# Patient Record
Sex: Female | Born: 1945 | Race: White | Hispanic: No | Marital: Married | Smoking: Former smoker
Health system: Southern US, Community
[De-identification: ages and names within clinical notes are randomized; demographics above are authoritative.]

## PROBLEM LIST (undated history)

## (undated) DIAGNOSIS — H9190 Unspecified hearing loss, unspecified ear: Secondary | ICD-10-CM

## (undated) DIAGNOSIS — K219 Gastro-esophageal reflux disease without esophagitis: Secondary | ICD-10-CM

## (undated) DIAGNOSIS — M1990 Unspecified osteoarthritis, unspecified site: Secondary | ICD-10-CM

## (undated) DIAGNOSIS — M797 Fibromyalgia: Secondary | ICD-10-CM

## (undated) DIAGNOSIS — E669 Obesity, unspecified: Secondary | ICD-10-CM

## (undated) DIAGNOSIS — L42 Pityriasis rosea: Secondary | ICD-10-CM

## (undated) DIAGNOSIS — I10 Essential (primary) hypertension: Secondary | ICD-10-CM

## (undated) DIAGNOSIS — E785 Hyperlipidemia, unspecified: Secondary | ICD-10-CM

## (undated) DIAGNOSIS — N189 Chronic kidney disease, unspecified: Secondary | ICD-10-CM

## (undated) DIAGNOSIS — E119 Type 2 diabetes mellitus without complications: Secondary | ICD-10-CM

## (undated) DIAGNOSIS — Z8619 Personal history of other infectious and parasitic diseases: Secondary | ICD-10-CM

## (undated) HISTORY — DX: Obesity, unspecified: E66.9

## (undated) HISTORY — PX: STAPEDECTOMY: SHX2435

## (undated) HISTORY — DX: Pityriasis rosea: L42

## (undated) HISTORY — PX: COLONOSCOPY: SHX174

## (undated) HISTORY — PX: ABDOMINAL HYSTERECTOMY: SHX81

## (undated) HISTORY — PX: CHOLECYSTECTOMY: SHX55

## (undated) HISTORY — DX: Chronic kidney disease, unspecified: N18.9

## (undated) HISTORY — DX: Type 2 diabetes mellitus without complications: E11.9

## (undated) HISTORY — PX: APPENDECTOMY: SHX54

---

## 1997-10-03 ENCOUNTER — Emergency Department (HOSPITAL_COMMUNITY): Admission: EM | Admit: 1997-10-03 | Discharge: 1997-10-03 | Payer: Self-pay | Admitting: Emergency Medicine

## 1997-10-16 ENCOUNTER — Ambulatory Visit (HOSPITAL_COMMUNITY): Admission: RE | Admit: 1997-10-16 | Discharge: 1997-10-16 | Payer: Self-pay

## 1998-05-31 ENCOUNTER — Emergency Department (HOSPITAL_COMMUNITY): Admission: EM | Admit: 1998-05-31 | Discharge: 1998-05-31 | Payer: Self-pay

## 1999-11-15 ENCOUNTER — Encounter: Payer: Self-pay | Admitting: Internal Medicine

## 1999-11-15 ENCOUNTER — Encounter: Admission: RE | Admit: 1999-11-15 | Discharge: 1999-11-15 | Payer: Self-pay | Admitting: Internal Medicine

## 2002-06-25 ENCOUNTER — Encounter: Payer: Self-pay | Admitting: General Surgery

## 2002-06-25 ENCOUNTER — Encounter: Admission: RE | Admit: 2002-06-25 | Discharge: 2002-06-25 | Payer: Self-pay | Admitting: General Surgery

## 2003-04-23 ENCOUNTER — Encounter: Admission: RE | Admit: 2003-04-23 | Discharge: 2003-04-27 | Payer: Self-pay | Admitting: Internal Medicine

## 2004-04-03 DIAGNOSIS — Z8619 Personal history of other infectious and parasitic diseases: Secondary | ICD-10-CM

## 2004-04-03 HISTORY — DX: Personal history of other infectious and parasitic diseases: Z86.19

## 2004-04-03 HISTORY — PX: CARPAL TUNNEL RELEASE: SHX101

## 2004-04-03 HISTORY — PX: WRIST ARTHROSCOPY: SUR100

## 2004-04-03 LAB — HM COLONOSCOPY

## 2004-08-20 ENCOUNTER — Emergency Department (HOSPITAL_COMMUNITY): Admission: EM | Admit: 2004-08-20 | Discharge: 2004-08-20 | Payer: Self-pay | Admitting: Emergency Medicine

## 2004-12-06 ENCOUNTER — Encounter: Admission: RE | Admit: 2004-12-06 | Discharge: 2004-12-06 | Payer: Self-pay | Admitting: Internal Medicine

## 2005-01-03 ENCOUNTER — Ambulatory Visit (HOSPITAL_BASED_OUTPATIENT_CLINIC_OR_DEPARTMENT_OTHER): Admission: RE | Admit: 2005-01-03 | Discharge: 2005-01-03 | Payer: Self-pay | Admitting: Orthopedic Surgery

## 2005-01-03 ENCOUNTER — Ambulatory Visit (HOSPITAL_COMMUNITY): Admission: RE | Admit: 2005-01-03 | Discharge: 2005-01-03 | Payer: Self-pay | Admitting: Orthopedic Surgery

## 2005-01-23 ENCOUNTER — Encounter: Admission: RE | Admit: 2005-01-23 | Discharge: 2005-01-23 | Payer: Self-pay | Admitting: Internal Medicine

## 2005-05-16 ENCOUNTER — Encounter: Admission: RE | Admit: 2005-05-16 | Discharge: 2005-05-16 | Payer: Self-pay | Admitting: Internal Medicine

## 2007-01-14 ENCOUNTER — Encounter: Admission: RE | Admit: 2007-01-14 | Discharge: 2007-01-14 | Payer: Self-pay | Admitting: Internal Medicine

## 2007-04-04 HISTORY — PX: NASAL HEMORRHAGE CONTROL: SHX287

## 2007-12-09 ENCOUNTER — Emergency Department (HOSPITAL_BASED_OUTPATIENT_CLINIC_OR_DEPARTMENT_OTHER): Admission: EM | Admit: 2007-12-09 | Discharge: 2007-12-09 | Payer: Self-pay | Admitting: Emergency Medicine

## 2007-12-12 ENCOUNTER — Observation Stay (HOSPITAL_COMMUNITY): Admission: EM | Admit: 2007-12-12 | Discharge: 2007-12-13 | Payer: Self-pay | Admitting: Emergency Medicine

## 2008-01-20 ENCOUNTER — Encounter: Admission: RE | Admit: 2008-01-20 | Discharge: 2008-01-20 | Payer: Self-pay | Admitting: Internal Medicine

## 2008-09-18 ENCOUNTER — Encounter: Admission: RE | Admit: 2008-09-18 | Discharge: 2008-09-18 | Payer: Self-pay | Admitting: Internal Medicine

## 2009-03-29 ENCOUNTER — Encounter: Admission: RE | Admit: 2009-03-29 | Discharge: 2009-03-29 | Payer: Self-pay | Admitting: Internal Medicine

## 2010-04-15 ENCOUNTER — Encounter
Admission: RE | Admit: 2010-04-15 | Discharge: 2010-04-15 | Payer: Self-pay | Source: Home / Self Care | Attending: Unknown Physician Specialty | Admitting: Unknown Physician Specialty

## 2010-04-24 ENCOUNTER — Encounter: Payer: Self-pay | Admitting: Internal Medicine

## 2010-08-16 NOTE — Consult Note (Signed)
[name], [name]                ACCOUNT NO.:  [id number]   MEDICAL RECORD NO.:  [id number]          PATIENT TYPE:  INP   LOCATION:  5118                         FACILITY:  [facility]   PHYSICIAN:  Corinna L. Lendell Caprice, MDDATE OF BIRTH:  1946-03-22   DATE OF CONSULTATION:  12/13/2007  DATE OF DISCHARGE:  12/13/2007                                 CONSULTATION   REASON FOR CONSULTATION:  Chest/abdominal pain.   IMPRESSION/RECOMMENDATIONS:  1. Left chest/left upper quadrant and flank pain:  I doubt this is      cardiac ischemia.  Her symptoms were very atypical.  Her EKG is      normal.  She has no history of coronary artery disease and her only      cardiac risk factor is smoking.  However, she is anemic and may be      having some strain.  Therefore, I will check a set of cardiac      enzymes.  2. Acute blood loss anemia.  3. Epistaxis status post endoscopic cautery of left posterior      epistaxis.  4. Tobacco abuse, counseled against.   HISTORY OF PRESENT ILLNESS:  [name] is a pleasant 65 year old white  female patient of Dr. Valentina Lucks who was admitted to Dr. Jenne Pane' service  after undergoing the above procedure last night.  This morning, she  began having left upper quadrant/left lower chest and thigh pain.  It  was crampy in nature.  It felt like gas pains.  Her husband reports that  she was doubled over.  It was sharp.  It was not pleuritic in nature.  She has no shortness of breath.  She feels better after getting  simethicone and morphine.   PAST MEDICAL HISTORY:  None.   MEDICATIONS:  Home medications none.  Here she has received morphine,  simethicone, and aspirin.   SOCIAL HISTORY:  The patient works at a bank.  She smokes about half a  pack of cigarettes a day.  She is married.  No excessive drinking or  drug use.   FAMILY HISTORY:  Her grandfather had heart disease.   REVIEW OF SYSTEMS:  As above, otherwise negative.   PHYSICAL EXAMINATION:  VITAL SIGNS:   Temperature is 98.5, pulse 100,  respiratory rate 18, blood pressure 120/72, and oxygen saturation 94% on  room air.  GENERAL:  The patient is well-nourished, well-developed in no acute  distress.  HEENT:  Normocephalic and atraumatic.  Pupils equal, round, and reactive  to light.  Pale conjunctivae.  Moist mucous membranes.  NECK:  Supple.  No carotid bruits.  No lymphadenopathy.  LUNGS:  Clear to auscultation bilaterally without wheezes, rhonchi, or  rales.  CARDIOVASCULAR:  Regular rate and rhythm without murmurs, gallops, or  rubs.  No chest wall tenderness to palpation.  ABDOMEN:  Normal bowel sounds, soft, nontender, and nondistended.  GU/RECTAL:  Deferred.  EXTREMITIES:  No clubbing, cyanosis, or edema.  SKIN:  No rash.  PSYCHIATRIC:  Normal affect.  NEUROLOGIC:  Alert and oriented.  Cranial nerves and sensorimotor exams  were intact.   LABORATORY  DATA:  Her white blood cell count is 10.9, hemoglobin 8.4,  hematocrit 24, and MCV is 91.  On December 09, 2007, her hemoglobin was  11.6.  EKG shows sinus tachycardia.   Thank you Dr. Jenne Pane for this consultation.  If the patient's cardiac  enzymes, symptoms, and hemoglobin remains stable, she may be discharged  from my standpoint.      Corinna L. Lendell Caprice, MD  Electronically Signed     CLS/MEDQ  D:  12/13/2007  T:  12/13/2007  Job:  [id number]   cc:   Thora Lance, M.D.

## 2010-08-16 NOTE — Op Note (Signed)
[name], [name]                ACCOUNT NO.:  [id number]   MEDICAL RECORD NO.:  [id number]          PATIENT TYPE:  INP   LOCATION:  5118                         FACILITY:  [facility]   PHYSICIAN:  Antony Contras, MD     DATE OF BIRTH:  03-01-1946   DATE OF PROCEDURE:  12/12/2007  DATE OF DISCHARGE:                               OPERATIVE REPORT   PREOPERATIVE DIAGNOSIS:  Left posterior epistaxis.   POSTOPERATIVE DIAGNOSIS:  Left posterior epistaxis.   PROCEDURE:  Endoscopic cautery of left posterior epistaxis with  posterior packing.   SURGEON:  Excell Seltzer. Jenne Pane, MD   ANESTHESIA:  General endotracheal anesthesia.   COMPLICATIONS:  None.   INDICATIONS:  The patient is a 65 year old white female who has had  repetitive bleeding from the left side of her nose for the past 5 days  that has not responded to multiple attempts at packing including  endoscopic packing in the office.  She has resumed bleeding today and  returned to the emergency department and was brought to the operating  room for management under anesthesia.   FINDINGS:  A bleeding site was easily identified at the posterior middle  meatus underneath the middle turbinate, which appears to represent the  sphenopalatine artery.  Bleeding was controlled with electrocautery and  packing with thrombin-soaked Gelfoam and a Merocel pack.   DESCRIPTION OF PROCEDURE:  The patient was identified in the holding  room and informed consent having been obtained including discussion of  risks, benefits, alternatives, the patient was brought to the operative  suite, and put on the operative table in supine position.  Anesthesia  was induced and the patient was intubated by anesthesia team without  difficulty.  The patient was given intravenous fluids for resuscitation  during the procedure.  The nose was packed with Afrin pledgets on both  sides and the face was prepped and draped in sterile fashion.  Pledget  was removed after  several minutes and the right passage was first  examined with a 0-degree telescope and clot suctioned.  No bleeding was  seen.  On the left side, the 0-degree telescope was also used and clot  and fibrillar Surgicel were removed using suction and forceps.  The  bleeding site was then identified as described above.  Suction cautery  in the setting of 30 was then used to cauterize the area.  At first,  this did not fully controlled the bleeding and a hole was formed in the  posterior root of the middle turbinate as a result.  A through cut  forceps was used to divide the inferior bridge of tissue, which allowed  better cautery of the vessel.  This was again performed with a suction  cautery.  After doing this, there was still slight oozing from the site,  and so a piece of Gelfoam soaked in thrombin was packed against the  area.  This controlled bleeding completely even with blood pressure  being elevated slightly with  medication.  In order to hold the Gelfoam in place, a small Merocel pack  was then placed under  the middle turbinate against the Gelfoam.  The  nose was suctioned and as was the throat.  The patient was then turned  back to anesthesia for wake up, was extubated, and moved to recovery  room in stable condition.      Antony Contras, MD  Electronically Signed     DDB/MEDQ  D:  12/12/2007  T:  12/13/2007  Job:  [id number]

## 2010-08-19 NOTE — Op Note (Signed)
[name], [name]                ACCOUNT NO.:  [id number]   MEDICAL RECORD NO.:  [id number]          PATIENT TYPE:  AMB   LOCATION:  DSC                          FACILITY:  [facility]   PHYSICIAN:  Katy Fitch. Sypher, M.D. DATE OF BIRTH:  1946-01-28   DATE OF PROCEDURE:  01/03/2005  DATE OF DISCHARGE:                                 OPERATIVE REPORT   PREOP DIAGNOSIS:  1.  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  2.  Possible volar ganglion versus synovial cyst due to chronic      scaphotrapezial trapezoid arthrosis.  3.  Chronic right carpal tunnel syndrome with positive electrodiagnostic      studies.   POSTOP DIAGNOSIS:  1.  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  2.  Possible volar ganglion versus synovial cyst due to chronic      scaphotrapezial trapezoid arthrosis.  3.  Chronic right carpal tunnel syndrome with positive electrodiagnostic      studies.   OPERATION:  1.  Examination of right wrist under anesthesia confirming synovial cyst      without evidence of typical volar ganglion.  2.  Injection of right wrist scaphotrapezial trapezoid joint with 1 mL of      Depo-Medrol 40 milligrams per mL and 1 mL of 2% plain lidocaine.  3.  Right carpal tunnel release.   OPERATING SURGEON:  Josephine Igo.   ASSISTANT:  Annye Rusk PA-C.   ANESTHESIA:  General by LMA, supervising anesthesiologist is Dr. Autumn Patty.   INDICATIONS:  [name] is a 65 year old right-hand dominant  woman referred through the courtesy of Lavinia Sharps, family nurse  practitioner employed at [employer]. She was noted have a painful and  numb right hand. [name] identified a volar swelling over the  scaphotrapezial trapezoid joint and clinical findings including a positive  Tinel sign and a positive wrist flexion test consistent with right carpal  tunnel syndrome.   She referred [name] for a upper extremity  orthopedic consult. Clinical  examination confirmed scaphotrapezial trapezoid arthrosis on clinical  examination as well as plain x-rays and an ill-defined volar swelling  consistent with a ganglion versus synovial cyst due to her chronic  arthrosis. 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   Due to failed respond to nonoperative measures, she is brought to the  operating at this time for release of her right transversal carpal ligament  and injection of her scaphotrapezial trapezoid joint with possible  debridement.   PROCEDURE:  [name] is brought to the operating room and placed supine  position on the operating table.   Following anesthesia consultation by Dr. Sampson Goon, general anesthesia by  LMA was selected.   Following induction of stable general anesthesia, the right arm was prepped  with Betadine soap solution, sterilely draped.   Under anesthesia the scaphotrapezial trapezoid joint and Watson's maneuver  carefully tested for stability. There was hypermobility of the  scaphotrapezial trapezoid joint with  synovial thickening but a typical volar  ganglion could not be confirmed. The plain x-rays were reviewed and once  again documented bone-on-bone arthropathy at the scaphotrapezial trapezoid  joint.   The right arm was then prepped with Betadine soap solution and sterilely  draped. Following exsanguination of the limb with Esmarch bandage, an  arterial tourniquet on the proximal brachium was inflated to 230 mmHg.  Procedure commenced with a short incision in line of the ring finger in the  palm.  The subcutaneous tissues were carefully divided revealing palmar  fascia. The was split longitudinally to reveal the common sensory branch of  the median nerve.   These were followed back to transverse carpal ligament which was carefully   isolated from the median nerve. The ligament was then released along its  ulnar border extending into the distal forearm. There was marked  tenosynovitis within the ulnar bursa. The nerve was clearly compressed  against the distal margin of the transverse carpal ligament. The canal was  widely opened upon release of the ligament.   The wound was then repaired with intradermal 3-0 Prolene. Attention was then  directed the scaphotrapezial trapezoid joint. A 27 gauge needle was threaded  directly into the joint and a 2 mL mixture of 1 mL of Depo-Medrol 40 mg per  mL and 1 mL of 2% lidocaine was injected directly in the scaphotrapezial  trapezoid joint.   I elected not to explore the joint as the synovial cyst may involute  following injection.   [name] tolerated surgery and anesthesia well. She was placed in a  compressive dressing with Xeroflo, sterile gauze and a volar plaster splint  supporting the right wrist. There no apparent complications.      Katy Fitch Sypher, M.D.  Electronically Signed     RVS/MEDQ  D:  01/03/2005  T:  01/03/2005  Job:  [id number]

## 2011-01-04 LAB — CBC
HCT: 24.8 — ABNORMAL LOW
HCT: 33.7 — ABNORMAL LOW
Hemoglobin: 11.6 — ABNORMAL LOW
Platelets: 264
Platelets: 242
RBC: 3.78 — ABNORMAL LOW
WBC: 5.3

## 2011-01-04 LAB — DIFFERENTIAL
Eosinophils Relative: 3
Lymphocytes Relative: 63 — ABNORMAL HIGH
Lymphs Abs: 3.3
Monocytes Relative: 8

## 2011-01-04 LAB — PROTIME-INR: Prothrombin Time: 13

## 2011-01-04 LAB — CARDIAC PANEL(CRET KIN+CKTOT+MB+TROPI): Total CK: 54

## 2011-01-04 LAB — HEMOGLOBIN AND HEMATOCRIT, BLOOD: HCT: 22.3 — ABNORMAL LOW

## 2011-03-02 ENCOUNTER — Other Ambulatory Visit: Payer: Self-pay | Admitting: Orthopedic Surgery

## 2011-03-02 DIAGNOSIS — M542 Cervicalgia: Secondary | ICD-10-CM

## 2011-03-03 ENCOUNTER — Ambulatory Visit
Admission: RE | Admit: 2011-03-03 | Discharge: 2011-03-03 | Disposition: A | Discharge status: Home / Self Care | Payer: Private Health Insurance - Indemnity | Source: Ambulatory Visit | Attending: Orthopedic Surgery | Admitting: Orthopedic Surgery

## 2011-03-03 DIAGNOSIS — M542 Cervicalgia: Secondary | ICD-10-CM

## 2011-03-05 ENCOUNTER — Other Ambulatory Visit: Payer: Self-pay

## 2011-03-06 ENCOUNTER — Other Ambulatory Visit: Payer: Self-pay

## 2011-03-14 ENCOUNTER — Other Ambulatory Visit: Payer: Self-pay | Admitting: Family Medicine

## 2011-03-14 DIAGNOSIS — E041 Nontoxic single thyroid nodule: Secondary | ICD-10-CM

## 2011-03-16 ENCOUNTER — Ambulatory Visit
Admission: RE | Admit: 2011-03-16 | Discharge: 2011-03-16 | Disposition: A | Discharge status: Home / Self Care | Payer: Private Health Insurance - Indemnity | Source: Ambulatory Visit | Attending: Family Medicine | Admitting: Family Medicine

## 2011-03-16 DIAGNOSIS — E041 Nontoxic single thyroid nodule: Secondary | ICD-10-CM

## 2011-04-25 ENCOUNTER — Other Ambulatory Visit: Payer: Self-pay | Admitting: Internal Medicine

## 2011-04-25 DIAGNOSIS — Z1231 Encounter for screening mammogram for malignant neoplasm of breast: Secondary | ICD-10-CM

## 2011-05-17 ENCOUNTER — Other Ambulatory Visit: Payer: Self-pay | Admitting: Internal Medicine

## 2011-05-17 DIAGNOSIS — E041 Nontoxic single thyroid nodule: Secondary | ICD-10-CM

## 2011-05-22 ENCOUNTER — Ambulatory Visit
Admission: RE | Admit: 2011-05-22 | Discharge: 2011-05-22 | Disposition: A | Discharge status: Home / Self Care | Payer: Private Health Insurance - Indemnity | Source: Ambulatory Visit | Attending: Internal Medicine | Admitting: Internal Medicine

## 2011-05-22 DIAGNOSIS — Z1231 Encounter for screening mammogram for malignant neoplasm of breast: Secondary | ICD-10-CM

## 2011-07-20 ENCOUNTER — Other Ambulatory Visit: Payer: Self-pay | Admitting: Orthopedic Surgery

## 2011-07-20 ENCOUNTER — Encounter (HOSPITAL_BASED_OUTPATIENT_CLINIC_OR_DEPARTMENT_OTHER)
Admission: RE | Admit: 2011-07-20 | Discharge: 2011-07-20 | Disposition: A | Discharge status: Home / Self Care | Payer: Managed Care, Other (non HMO) | Source: Ambulatory Visit | Attending: Orthopedic Surgery | Admitting: Orthopedic Surgery

## 2011-07-20 ENCOUNTER — Encounter (HOSPITAL_BASED_OUTPATIENT_CLINIC_OR_DEPARTMENT_OTHER): Payer: Self-pay | Admitting: *Deleted

## 2011-07-20 LAB — BASIC METABOLIC PANEL
Calcium: 9.4 mg/dL (ref 8.4–10.5)
Creatinine, Ser: 0.73 mg/dL (ref 0.50–1.10)
GFR calc non Af Amer: 89 mL/min — ABNORMAL LOW (ref >90)
Glucose, Bld: 89 mg/dL (ref 70–99)
Sodium: 135 mEq/L (ref 135–145)

## 2011-07-20 NOTE — Progress Notes (Signed)
To see pcp-dr Jonny Ruiz griffin today-will get ekg if one recent-then come by here for bmet-and ekg if needed

## 2011-07-25 ENCOUNTER — Encounter (HOSPITAL_BASED_OUTPATIENT_CLINIC_OR_DEPARTMENT_OTHER): Payer: Self-pay | Admitting: Anesthesiology

## 2011-07-25 ENCOUNTER — Ambulatory Visit (HOSPITAL_BASED_OUTPATIENT_CLINIC_OR_DEPARTMENT_OTHER)
Admission: RE | Admit: 2011-07-25 | Discharge: 2011-07-25 | Disposition: A | Discharge status: Home / Self Care | Payer: Managed Care, Other (non HMO) | Source: Ambulatory Visit | Attending: Orthopedic Surgery | Admitting: Orthopedic Surgery

## 2011-07-25 ENCOUNTER — Ambulatory Visit (HOSPITAL_BASED_OUTPATIENT_CLINIC_OR_DEPARTMENT_OTHER): Payer: Managed Care, Other (non HMO) | Admitting: Anesthesiology

## 2011-07-25 ENCOUNTER — Encounter (HOSPITAL_BASED_OUTPATIENT_CLINIC_OR_DEPARTMENT_OTHER): Payer: Self-pay | Admitting: *Deleted

## 2011-07-25 ENCOUNTER — Encounter (HOSPITAL_BASED_OUTPATIENT_CLINIC_OR_DEPARTMENT_OTHER)
Admission: RE | Disposition: A | Discharge status: Home / Self Care | Payer: Self-pay | Source: Ambulatory Visit | Attending: Orthopedic Surgery

## 2011-07-25 DIAGNOSIS — Z01812 Encounter for preprocedural laboratory examination: Secondary | ICD-10-CM | POA: Insufficient documentation

## 2011-07-25 DIAGNOSIS — G5600 Carpal tunnel syndrome, unspecified upper limb: Secondary | ICD-10-CM | POA: Insufficient documentation

## 2011-07-25 DIAGNOSIS — I10 Essential (primary) hypertension: Secondary | ICD-10-CM | POA: Insufficient documentation

## 2011-07-25 DIAGNOSIS — K219 Gastro-esophageal reflux disease without esophagitis: Secondary | ICD-10-CM | POA: Insufficient documentation

## 2011-07-25 DIAGNOSIS — IMO0001 Reserved for inherently not codable concepts without codable children: Secondary | ICD-10-CM | POA: Insufficient documentation

## 2011-07-25 DIAGNOSIS — E785 Hyperlipidemia, unspecified: Secondary | ICD-10-CM | POA: Insufficient documentation

## 2011-07-25 DIAGNOSIS — Z01810 Encounter for preprocedural cardiovascular examination: Secondary | ICD-10-CM | POA: Insufficient documentation

## 2011-07-25 HISTORY — DX: Fibromyalgia: M79.7

## 2011-07-25 HISTORY — DX: Personal history of other infectious and parasitic diseases: Z86.19

## 2011-07-25 HISTORY — DX: Hyperlipidemia, unspecified: E78.5

## 2011-07-25 HISTORY — DX: Unspecified osteoarthritis, unspecified site: M19.90

## 2011-07-25 HISTORY — DX: Unspecified hearing loss, unspecified ear: H91.90

## 2011-07-25 HISTORY — PX: CARPAL TUNNEL RELEASE: SHX101

## 2011-07-25 HISTORY — DX: Gastro-esophageal reflux disease without esophagitis: K21.9

## 2011-07-25 HISTORY — DX: Essential (primary) hypertension: I10

## 2011-07-25 SURGERY — CARPAL TUNNEL RELEASE
Anesthesia: General | Site: Hand | Laterality: Left | Wound class: Clean

## 2011-07-25 MED ORDER — FENTANYL CITRATE 0.05 MG/ML IJ SOLN
INTRAMUSCULAR | Status: DC | PRN
  Administered 2011-07-25: 50 ug via INTRAVENOUS

## 2011-07-25 MED ORDER — TRAMADOL HCL 50 MG PO TABS: ORAL_TABLET | ORAL | Status: AC

## 2011-07-25 MED ORDER — DEXAMETHASONE SODIUM PHOSPHATE 10 MG/ML IJ SOLN
INTRAMUSCULAR | Status: DC | PRN
  Administered 2011-07-25: 10 mg via INTRAVENOUS

## 2011-07-25 MED ORDER — METOCLOPRAMIDE HCL 5 MG/ML IJ SOLN
INTRAMUSCULAR | Status: DC | PRN
  Administered 2011-07-25: 10 mg via INTRAVENOUS

## 2011-07-25 MED ORDER — LIDOCAINE HCL (CARDIAC) 20 MG/ML IV SOLN
INTRAVENOUS | Status: DC | PRN
  Administered 2011-07-25: 60 mg via INTRAVENOUS

## 2011-07-25 MED ORDER — METOCLOPRAMIDE HCL 5 MG/ML IJ SOLN: 10.0000 mg | Freq: Once | INTRAMUSCULAR | Status: DC | PRN

## 2011-07-25 MED ORDER — FENTANYL CITRATE 0.05 MG/ML IJ SOLN
25.0000 ug | INTRAMUSCULAR | Status: DC | PRN
  Administered 2011-07-25: 50 ug via INTRAVENOUS

## 2011-07-25 MED ORDER — ONDANSETRON HCL 4 MG/2ML IJ SOLN
INTRAMUSCULAR | Status: DC | PRN
  Administered 2011-07-25: 4 mg via INTRAVENOUS

## 2011-07-25 MED ORDER — CHLORHEXIDINE GLUCONATE 4 % EX LIQD
60.0000 mL | Freq: Once | CUTANEOUS | Status: DC
Start: 2011-07-25 — End: 2011-07-25

## 2011-07-25 MED ORDER — MIDAZOLAM HCL 5 MG/5ML IJ SOLN
INTRAMUSCULAR | Status: DC | PRN
  Administered 2011-07-25: 2 mg via INTRAVENOUS

## 2011-07-25 MED ORDER — LACTATED RINGERS IV SOLN
INTRAVENOUS | Status: DC
  Administered 2011-07-25: 07:00:00 via INTRAVENOUS

## 2011-07-25 MED ORDER — LIDOCAINE HCL 2 % IJ SOLN
INTRAMUSCULAR | Status: DC | PRN
  Administered 2011-07-25: 3.5 mL

## 2011-07-25 MED ORDER — PROPOFOL 10 MG/ML IV EMUL
INTRAVENOUS | Status: DC | PRN
  Administered 2011-07-25: 200 mg via INTRAVENOUS

## 2011-07-25 SURGICAL SUPPLY — 39 items
BANDAGE ADHESIVE 1X3 (GAUZE/BANDAGES/DRESSINGS) IMPLANT
BANDAGE ELASTIC 3 VELCRO ST LF (GAUZE/BANDAGES/DRESSINGS) ×2 IMPLANT
BLADE SURG 15 STRL LF DISP TIS (BLADE) ×1 IMPLANT
BLADE SURG 15 STRL SS (BLADE) ×2
BNDG CMPR 9X4 STRL LF SNTH (GAUZE/BANDAGES/DRESSINGS) ×1
BNDG ESMARK 4X9 LF (GAUZE/BANDAGES/DRESSINGS) ×1 IMPLANT
BRUSH SCRUB EZ PLAIN DRY (MISCELLANEOUS) ×2 IMPLANT
CLOTH BEACON ORANGE TIMEOUT ST (SAFETY) ×2 IMPLANT
CORDS BIPOLAR (ELECTRODE) ×1 IMPLANT
COVER MAYO STAND STRL (DRAPES) ×2 IMPLANT
COVER TABLE BACK 60X90 (DRAPES) ×2 IMPLANT
CUFF TOURNIQUET SINGLE 18IN (TOURNIQUET CUFF) ×1 IMPLANT
DECANTER SPIKE VIAL GLASS SM (MISCELLANEOUS) ×1 IMPLANT
DRAPE EXTREMITY T 121X128X90 (DRAPE) ×2 IMPLANT
DRAPE SURG 17X23 STRL (DRAPES) ×2 IMPLANT
GLOVE BIO SURGEON STRL SZ7 (GLOVE) ×2 IMPLANT
GLOVE BIOGEL M STRL SZ7.5 (GLOVE) ×2 IMPLANT
GLOVE BIOGEL PI IND STRL 7.0 (GLOVE) IMPLANT
GLOVE BIOGEL PI INDICATOR 7.0 (GLOVE) ×3
GLOVE ORTHO TXT STRL SZ7.5 (GLOVE) ×2 IMPLANT
GOWN PREVENTION PLUS XLARGE (GOWN DISPOSABLE) ×3 IMPLANT
GOWN PREVENTION PLUS XXLARGE (GOWN DISPOSABLE) ×4 IMPLANT
NEEDLE 27GAX1X1/2 (NEEDLE) ×1 IMPLANT
PACK BASIN DAY SURGERY FS (CUSTOM PROCEDURE TRAY) ×2 IMPLANT
PAD CAST 3X4 CTTN HI CHSV (CAST SUPPLIES) ×1 IMPLANT
PADDING CAST ABS 4INX4YD NS (CAST SUPPLIES) ×1
PADDING CAST ABS COTTON 4X4 ST (CAST SUPPLIES) ×1 IMPLANT
PADDING CAST COTTON 3X4 STRL (CAST SUPPLIES) ×2
SPLINT PLASTER CAST XFAST 3X15 (CAST SUPPLIES) ×5 IMPLANT
SPLINT PLASTER XTRA FASTSET 3X (CAST SUPPLIES) ×5
SPONGE GAUZE 4X4 12PLY (GAUZE/BANDAGES/DRESSINGS) ×2 IMPLANT
STOCKINETTE 4X48 STRL (DRAPES) ×2 IMPLANT
STRIP CLOSURE SKIN 1/2X4 (GAUZE/BANDAGES/DRESSINGS) ×2 IMPLANT
SUT PROLENE 3 0 PS 2 (SUTURE) ×2 IMPLANT
SYR 3ML 23GX1 SAFETY (SYRINGE) IMPLANT
SYR CONTROL 10ML LL (SYRINGE) ×1 IMPLANT
TRAY DSU PREP LF (CUSTOM PROCEDURE TRAY) ×2 IMPLANT
UNDERPAD 30X30 INCONTINENT (UNDERPADS AND DIAPERS) ×2 IMPLANT
WATER STERILE IRR 1000ML POUR (IV SOLUTION) ×2 IMPLANT

## 2011-07-25 NOTE — Anesthesia Postprocedure Evaluation (Signed)
Anesthesia Post Note  Patient: [name]  Procedure(s) Performed: Procedure(s) (LRB): CARPAL TUNNEL RELEASE (Left)  Anesthesia type: General  Patient location: PACU  Post pain: Pain level controlled  Post assessment: Patient's Cardiovascular Status Stable  Last Vitals:  Filed Vitals:   07/25/11 0900  BP: 108/66  Pulse: 85  Temp:   Resp: 12    Post vital signs: Reviewed and stable  Level of consciousness: alert  Complications: No apparent anesthesia complications

## 2011-07-25 NOTE — Brief Op Note (Signed)
07/25/2011  8:12 AM  PATIENT:  Danielle Dess  66 y.o. female  PRE-OPERATIVE DIAGNOSIS:  Left Carpal Tunnel Syndrome  POST-OPERATIVE DIAGNOSIS:  Left Carpal Tunnel Syndrome  PROCEDURE:  CARPAL TUNNEL RELEASE (Left)  SURGEON:  Wyn Forster., MD   PHYSICIAN ASSISTANT:   ASSISTANTS: Mallory Shirk.A-C   ANESTHESIA:   general  EBL:  Total I/O In: 300 [I.V.:300] Out: -   BLOOD ADMINISTERED:none  DRAINS: none   LOCAL MEDICATIONS USED:  XYLOCAINE   SPECIMEN:  No Specimen  DISPOSITION OF SPECIMEN:  N/A  COUNTS:  YES  TOURNIQUET:   Total Tourniquet Time Documented: Upper Arm (Left) - 8 minutes  DICTATION: .Other Dictation: Dictation Number [id number]  PLAN OF CARE: Discharge to home after PACU  PATIENT DISPOSITION:  PACU - hemodynamically stable.

## 2011-07-25 NOTE — Anesthesia Preprocedure Evaluation (Signed)
Anesthesia Evaluation  Patient identified by MRN, date of birth, ID band Patient awake    Reviewed: Allergy & Precautions, H&P , NPO status , Patient's Chart, lab work & pertinent test results, reviewed documented beta blocker date and time   Airway Mallampati: II TM Distance: >3 FB Neck ROM: full    Dental   Pulmonary asthma ,          Cardiovascular hypertension, On Medications     Neuro/Psych  Neuromuscular disease negative psych ROS   GI/Hepatic Neg liver ROS, GERD-  Medicated and Controlled,  Endo/Other  negative endocrine ROS  Renal/GU negative Renal ROS  negative genitourinary   Musculoskeletal   Abdominal   Peds  Hematology negative hematology ROS (+)   Anesthesia Other Findings See surgeon's H&P   Reproductive/Obstetrics negative OB ROS                           Anesthesia Physical Anesthesia Plan  ASA: II  Anesthesia Plan: General   Post-op Pain Management:    Induction: Intravenous  Airway Management Planned: LMA  Additional Equipment:   Intra-op Plan:   Post-operative Plan: Extubation in OR  Informed Consent: I have reviewed the patients History and Physical, chart, labs and discussed the procedure including the risks, benefits and alternatives for the proposed anesthesia with the patient or authorized representative who has indicated his/her understanding and acceptance.     Plan Discussed with: CRNA and Surgeon  Anesthesia Plan Comments:         Anesthesia Quick Evaluation

## 2011-07-25 NOTE — Op Note (Signed)
Op note dictated [id number]

## 2011-07-25 NOTE — H&P (Signed)
[name] is an 66 y.o. female.   Chief Complaint: Complaining of chronic and progressive left hand numbness and tingling  HPI: Patient is a 66 year old right-hand-dominant female who presented to our office in early April, complaining of chronic and progressive left hand numbness and tingling. She underwent right carpal tunnel release approximately 6 years ago. She has done well in regards to that. Her left hand, developed numbness and tingling approximately 6 months ago. She presented to our office for further evaluation and treatment. Nerve conduction studies in the office revealed a moderately severe left carpal tunnel syndrome.  Past Medical History  Diagnosis Date  . Fibromyalgia   . Arthritis   . GERD (gastroesophageal reflux disease)   . Hyperlipidemia   . History of Legionnaire's disease 2006    was in hospital 17 days-has some scar tissue lungs  . Hypertension   . Asthma     hx scar tissue lung-only occ bronchitis  . Hard of hearing     Past Surgical History  Procedure Date  . Wrist arthroscopy 2006    right  . Carpal tunnel release 2006    rt  . Nasal hemorrhage control 2009  . Stapedectomy     bilateral-wears hearing aids  . Cholecystectomy   . Abdominal hysterectomy   . Colonoscopy     History reviewed. No pertinent family history. Social History:  reports that she quit smoking about 3 years ago. She does not have any smokeless tobacco history on file. She reports that she drinks alcohol. She reports that she does not use illicit drugs.  Allergies:  Allergies  Allergen Reactions  . Amoxicillin Anaphylaxis  . Codeine Shortness Of Breath  . Penicillins Anaphylaxis  . Sulfa Antibiotics Shortness Of Breath    Medications Prior to Admission  Medication Sig Dispense Refill  . cyclobenzaprine (FLEXERIL) 10 MG tablet Take 10 mg by mouth 3 (three) times daily as needed.      . diphenhydramine-acetaminophen (TYLENOL PM) 25-500 MG TABS Take 1 tablet by mouth  at bedtime as needed.      . hydrochlorothiazide (HYDRODIURIL) 25 MG tablet Take 25 mg by mouth daily.      . ranitidine (ZANTAC) 75 MG tablet Take 75 mg by mouth 2 (two) times daily.      . rosuvastatin (CRESTOR) 10 MG tablet Take 10 mg by mouth every other day.      . vitamin C (ASCORBIC ACID) 500 MG tablet Take 500 mg by mouth daily.        No results found for this or any previous visit (from the past 48 hour(s)).  No results found.   Pertinent items are noted in HPI.  Blood pressure 124/89, pulse 88, temperature 97.7 F (36.5 C), temperature source Oral, resp. rate 16, height 5\' 2"  (1.575 m), weight 77.111 kg (170 lb), SpO2 98.00%.  General appearance: alert Head: Normocephalic, without obvious abnormality Neck: supple, symmetrical, trachea midline Resp: clear to auscultation bilaterally Cardio: regular rate and rhythm, S1, S2 normal, no murmur, click, rub or gallop GI: normal findings: bowel sounds normal Extremities: Examination of her hand reveals positive Phalen's and positive Tinel's. She has full range of motion of her fingers without triggering. She has normal sweat pattern present. Review of her nerve conduction studies revealed a moderately severe left carpal tunnel syndrome. Pulses: 2+ and symmetric Skin: normal Neurologic: Grossly normal    Assessment/Plan Impression: Left carpal tunnel syndrome  Plan: Patient to be taken to the operating room to undergo  left carpal tunnel release. The procedure risks, benefits, and postoperative course were discussed with the patient at length and she was in agreement with this plan.  DASNOIT,Vania Rosero J 07/25/2011, 7:20 AM     H&P documentation: 07/25/2011  -History and Physical Reviewed  -Patient has been re-examined  -No change in the plan of care  Wyn Forster, MD

## 2011-07-25 NOTE — Transfer of Care (Signed)
Immediate Anesthesia Transfer of Care Note  Patient: [name]  Procedure(s) Performed: Procedure(s) (LRB): CARPAL TUNNEL RELEASE (Left)  Patient Location: PACU  Anesthesia Type: General  Level of Consciousness: sedated  Airway & Oxygen Therapy: Patient Spontanous Breathing and Patient connected to face mask oxygen  Post-op Assessment: Report given to PACU RN and Post -op Vital signs reviewed and stable  Post vital signs: Reviewed and stable  Complications: No apparent anesthesia complications

## 2011-07-25 NOTE — Anesthesia Procedure Notes (Signed)
Procedure Name: LMA Insertion Date/Time: 07/25/2011 7:54 AM Performed by: Burna Cash Pre-anesthesia Checklist: Patient identified, Emergency Drugs available, Suction available and Patient being monitored Patient Re-evaluated:Patient Re-evaluated prior to inductionOxygen Delivery Method: Circle System Utilized Preoxygenation: Pre-oxygenation with 100% oxygen Intubation Type: IV induction Ventilation: Mask ventilation without difficulty LMA: LMA inserted LMA Size: 4.0 Number of attempts: 1 Airway Equipment and Method: bite block Placement Confirmation: positive ETCO2 Tube secured with: Tape Dental Injury: Teeth and Oropharynx as per pre-operative assessment

## 2011-07-25 NOTE — Discharge Instructions (Signed)

## 2011-07-26 ENCOUNTER — Encounter (HOSPITAL_BASED_OUTPATIENT_CLINIC_OR_DEPARTMENT_OTHER): Payer: Self-pay | Admitting: Orthopedic Surgery

## 2011-07-26 NOTE — Op Note (Signed)
NAME:  [name], [name]                 ACCOUNT NO.:  MEDICAL RECORD NO.:  [id number]  LOCATION:                                 FACILITY:  PHYSICIAN:  Katy Fitch. Haroldine Redler, M.D.      DATE OF BIRTH:  DATE OF PROCEDURE:  07/25/2011 DATE OF DISCHARGE:                              OPERATIVE REPORT   PREOPERATIVE DIAGNOSIS:  Chronic left carpal tunnel syndrome.  POSTOPERATIVE DIAGNOSIS:  Chronic left carpal tunnel syndrome.  OPERATIONS:  Release of left transverse carpal ligament.  OPERATING SURGEON:  Katy Fitch. Tanzie Rothschild, M.D..  ASSISTANT:  Jonni Sanger, P.A..  ANESTHESIA:  General by LMA.  SUPERVISING ANESTHESIOLOGIST:  Janetta Hora. Gelene Mink, M.D.  INDICATIONS:  [name] is a 66 year old woman, well known former patient, who has had prior right carpal tunnel release.  She is employed by [employer].  She has a history of increasing pain and numbness to the left hand. Clinical examination revealed signs of chronic carpal tunnel syndrome. Electrodiagnostic studies repeated documenting very significant median entrapment neuropathy on the left.  We advised to proceed with release of left transcarpal ligament. Surgeon aftercare were described in detail.  She understands the risks and benefits of surgery from her prior experience.  After informed consent, she was brought to the operating at this time.  PROCEDURE:  [name] was brought to room 8 of the [facility] and placed in supine position on the operating table.  Following the induction of general anesthesia by LMA technique under Dr. Thornton Dales direct supervision, the left arm was prepped with Betadine soap and solution, sterilely draped.  A pneumatic tourniquet was applied to proximal left brachium.  Following exsanguination of the left arm with Esmarch bandage, arterial tourniquet was inflated to 220 mmHg.  Following routine surgical time-out, procedure commenced with a short incision in the line  of the ring finger and the palm.  Subcutaneous tissues were carefully divided on the palmar fascia.  This split longitudinally to reveal the common sensory branch, median nerve, and the superficial palmar arch.  The common sensory branches were followed back to the median nerve proper which was gently separated from the transverse carpal ligament with a Insurance risk surveyor.  A channel was created superficial to the nerve on the ulnar aspect of the ligament which was subsequently released with scissors extending into the distal forearm, this widely opened carpal canal.  No mass or pigments were noted.  Bleeding points along the margin of the released ligament were electrocauterized with bipolar forceps.  The hand was inspected for masses or predicaments, none were identified. The wound was then repaired with intradermal 3-0 Prolene suture.  A compressive dressing was applied with a volar plaster splint maintaining the wrist in 10 degrees of dorsiflexion.  For aftercare, [name] was provided a prescription for tramadol 50 mg 1 p.o. q.4 hours p.r.n. pain 20 tablets without refill.     Katy Fitch Konner Saiz, M.D.     RVS/MEDQ  D:  07/25/2011  T:  07/25/2011  Job:  [id number]

## 2011-08-01 ENCOUNTER — Other Ambulatory Visit: Payer: Self-pay | Admitting: Internal Medicine

## 2011-08-01 ENCOUNTER — Ambulatory Visit
Admission: RE | Admit: 2011-08-01 | Discharge: 2011-08-01 | Disposition: A | Discharge status: Home / Self Care | Payer: Managed Care, Other (non HMO) | Source: Ambulatory Visit | Attending: Internal Medicine | Admitting: Internal Medicine

## 2011-08-01 DIAGNOSIS — R1032 Left lower quadrant pain: Secondary | ICD-10-CM

## 2011-08-01 MED ORDER — IOHEXOL 300 MG/ML  SOLN
100.0000 mL | Freq: Once | INTRAMUSCULAR | Status: AC | PRN
  Administered 2011-08-01: 100 mL via INTRAVENOUS

## 2011-10-17 ENCOUNTER — Other Ambulatory Visit: Payer: Private Health Insurance - Indemnity

## 2011-10-19 ENCOUNTER — Ambulatory Visit
Admission: RE | Admit: 2011-10-19 | Discharge: 2011-10-19 | Disposition: A | Discharge status: Home / Self Care | Payer: Managed Care, Other (non HMO) | Source: Ambulatory Visit | Attending: Internal Medicine | Admitting: Internal Medicine

## 2011-10-19 DIAGNOSIS — E041 Nontoxic single thyroid nodule: Secondary | ICD-10-CM

## 2012-10-30 ENCOUNTER — Other Ambulatory Visit: Payer: Self-pay

## 2012-10-30 DIAGNOSIS — Z1231 Encounter for screening mammogram for malignant neoplasm of breast: Secondary | ICD-10-CM

## 2012-11-14 ENCOUNTER — Ambulatory Visit
Admission: RE | Admit: 2012-11-14 | Discharge: 2012-11-14 | Disposition: A | Discharge status: Home / Self Care | Payer: Medicare Other | Source: Ambulatory Visit

## 2012-11-14 DIAGNOSIS — Z1231 Encounter for screening mammogram for malignant neoplasm of breast: Secondary | ICD-10-CM

## 2013-02-11 LAB — BASIC METABOLIC PANEL
BUN: 10 mg/dL (ref 4–21)
Creatinine: 0.9 mg/dL (ref 0.5–1.1)
Glucose: 102 mg/dL
Sodium: 135 mmol/L — AB (ref 137–147)

## 2013-06-05 ENCOUNTER — Encounter: Payer: Self-pay | Admitting: General Practice

## 2013-06-05 DIAGNOSIS — N183 Chronic kidney disease, stage 3 unspecified: Secondary | ICD-10-CM | POA: Insufficient documentation

## 2013-06-05 DIAGNOSIS — E119 Type 2 diabetes mellitus without complications: Secondary | ICD-10-CM | POA: Insufficient documentation

## 2013-06-05 DIAGNOSIS — E1169 Type 2 diabetes mellitus with other specified complication: Secondary | ICD-10-CM | POA: Insufficient documentation

## 2013-06-05 DIAGNOSIS — I10 Essential (primary) hypertension: Secondary | ICD-10-CM | POA: Insufficient documentation

## 2013-06-05 DIAGNOSIS — E785 Hyperlipidemia, unspecified: Secondary | ICD-10-CM

## 2013-06-05 DIAGNOSIS — R739 Hyperglycemia, unspecified: Secondary | ICD-10-CM

## 2013-06-27 ENCOUNTER — Encounter: Payer: Self-pay | Admitting: Family Medicine

## 2013-06-27 ENCOUNTER — Ambulatory Visit (INDEPENDENT_AMBULATORY_CARE_PROVIDER_SITE_OTHER): Payer: Medicare Other | Admitting: Family Medicine

## 2013-06-27 VITALS — BP 120/82 | HR 82 | Temp 97.9°F | Resp 16 | Wt 182.2 lb

## 2013-06-27 DIAGNOSIS — J019 Acute sinusitis, unspecified: Secondary | ICD-10-CM

## 2013-06-27 MED ORDER — CLARITHROMYCIN 500 MG PO TABS: 500.0000 mg | ORAL_TABLET | Freq: Two times a day (BID) | ORAL | Status: DC

## 2013-06-27 NOTE — Patient Instructions (Signed)
Follow up as needed Start the Biaxin- 1 tab twice daily w/ food Drink plenty of fluids REST! Continue Claritin or Zyrtec daily Add the Flonase daily Call with any questions or concerns Hang in there!

## 2013-06-27 NOTE — Progress Notes (Signed)
Pre visit review using our clinic review tool, if applicable. No additional management support is needed unless otherwise documented below in the visit note. 

## 2013-06-27 NOTE — Progress Notes (Signed)
   Subjective:    Patient ID: [name], female    DOB: October 22, 1945, 68 y.o.   MRN: [id number]  Sinusitis Associated symptoms include coughing, ear pain and headaches.  Otalgia  Associated symptoms include coughing and headaches.  Cough Associated symptoms include ear pain and headaches.  Headache  Associated symptoms include coughing and ear pain.   New.  URI- pt has hx of allergies and has been taking OTC meds w/o relief (antihistamine and nasal steroid).  Now w/ L facial pain/pressure/swelling.  L ear pressure.  No fevers.  + nasal congestion.  Minimal cough.  No sick contacts.  Has seen allergist previously.   Review of Systems  HENT: Positive for ear pain.   Respiratory: Positive for cough.   Neurological: Positive for headaches.   For ROS see HPI     Objective:   Physical Exam  Vitals reviewed. Constitutional: She appears well-developed and well-nourished. No distress.  HENT:  Head: Normocephalic and atraumatic.  Right Ear: Tympanic membrane normal.  Left Ear: Tympanic membrane normal.  Nose: Mucosal edema and rhinorrhea present. Right sinus exhibits no maxillary sinus tenderness and no frontal sinus tenderness. Left sinus exhibits maxillary sinus tenderness and frontal sinus tenderness.  Mouth/Throat: Uvula is midline and mucous membranes are normal. Posterior oropharyngeal erythema present. No oropharyngeal exudate.  Eyes: Conjunctivae and EOM are normal. Pupils are equal, round, and reactive to light.  Neck: Normal range of motion. Neck supple.  Cardiovascular: Normal rate, regular rhythm and normal heart sounds.   Pulmonary/Chest: Effort normal and breath sounds normal. No respiratory distress. She has no wheezes.  Lymphadenopathy:    She has no cervical adenopathy.          Assessment & Plan:

## 2013-06-27 NOTE — Assessment & Plan Note (Signed)
Pt's sxs and PE consistent w/ infxn.  Start Biaxin due to pt's allergies.  Reviewed supportive care and red flags that should prompt return.  Pt expressed understanding and is in agreement w/ plan.

## 2013-07-21 ENCOUNTER — Ambulatory Visit: Payer: Medicare Other | Admitting: Family Medicine

## 2013-07-31 ENCOUNTER — Ambulatory Visit (INDEPENDENT_AMBULATORY_CARE_PROVIDER_SITE_OTHER): Payer: Medicare Other | Admitting: Internal Medicine

## 2013-07-31 ENCOUNTER — Encounter: Payer: Self-pay | Admitting: Internal Medicine

## 2013-07-31 VITALS — BP 110/68 | HR 100 | Temp 98.0°F | Wt 182.0 lb

## 2013-07-31 DIAGNOSIS — J019 Acute sinusitis, unspecified: Secondary | ICD-10-CM

## 2013-07-31 DIAGNOSIS — J309 Allergic rhinitis, unspecified: Secondary | ICD-10-CM

## 2013-07-31 MED ORDER — PREDNISONE 10 MG PO TABS
ORAL_TABLET | ORAL | Status: DC
Start: 2013-07-31 — End: 2013-09-10

## 2013-07-31 MED ORDER — DOXYCYCLINE HYCLATE 100 MG PO TABS: 100.0000 mg | ORAL_TABLET | Freq: Two times a day (BID) | ORAL | Status: DC

## 2013-07-31 NOTE — Progress Notes (Signed)
Pre visit review using our clinic review tool, if applicable. No additional management support is needed unless otherwise documented below in the visit note. 

## 2013-07-31 NOTE — Progress Notes (Signed)
Subjective:    Patient ID: [name], female    DOB: 1945-07-13, 68 y.o.   MRN: [id number]  DOS:  07/31/2013 Type of  visit: Acute visit Was seen 06/27/2012 by PCP, diagnosed with sinusitis, status post antibiotics and Flonase. Felt better initially but then 10 days later symptoms returned: Facial pressure, worse on the left. Left ear pain Despite pressure on the sinuses she does not have nasal discharge or postnasal drip.  ROS Low grade  temperature last night?. No nausea, vomiting, diarrhea. Mild cough, no chest congestion, no wheezing but the patient decided to use her inhaler 2 or 3 times a day for the last few days. Reports that her allergies are severe this year, itchy eyes, nose, watery eyes. Likes a referral to see the specialist.   Past Medical History  Diagnosis Date  . Fibromyalgia   . Arthritis   . GERD (gastroesophageal reflux disease)   . Hyperlipidemia   . History of Legionnaire's disease 2006    was in hospital 17 days-has some scar tissue lungs  . Hypertension   . Asthma     hx scar tissue lung-only occ bronchitis  . Hard of hearing   . Chronic kidney disease   . Diabetes mellitus without complication   . Obesity   . Pityriasis rosea     Past Surgical History  Procedure Laterality Date  . Wrist arthroscopy  2006    right  . Carpal tunnel release  2006    rt  . Nasal hemorrhage control  2009  . Stapedectomy      bilateral-wears hearing aids  . Cholecystectomy    . Abdominal hysterectomy    . Colonoscopy    . Carpal tunnel release  07/25/2011    Procedure: CARPAL TUNNEL RELEASE;  Surgeon: Wyn Forsterobert V Sypher Jr., MD;  Location: [facility];  Service: Orthopedics;  Laterality: Left;  . Appendectomy      History   Social History  . Marital Status: Married    Spouse Name: N/A    Number of Children: N/A  . Years of Education: N/A   Occupational History  . Not on file.   Social History Main Topics  . Smoking status: Former  Smoker    Quit date: 07/19/2008  . Smokeless tobacco: Not on file  . Alcohol Use: Yes     Comment: occ  . Drug Use: No  . Sexual Activity:    Other Topics Concern  . Not on file   Social History Narrative  . No narrative on file        Medication List       This list is accurate as of: 07/31/13  1:21 PM.  Always use your most recent med list.               albuterol 108 (90 BASE) MCG/ACT inhaler  Commonly known as:  PROVENTIL HFA;VENTOLIN HFA  Inhale into the lungs every 6 (six) hours as needed for wheezing or shortness of breath.     baclofen 10 MG tablet  Commonly known as:  LIORESAL  Take 10 mg by mouth daily.     doxycycline 100 MG tablet  Commonly known as:  VIBRA-TABS  Take 1 tablet (100 mg total) by mouth 2 (two) times daily.     fluticasone 50 MCG/ACT nasal spray  Commonly known as:  FLONASE  Place 2 sprays into both nostrils daily.     gabapentin 100 MG capsule  Commonly known as:  NEURONTIN  Take 100 mg by mouth 3 (three) times daily.     losartan-hydrochlorothiazide 100-25 MG per tablet  Commonly known as:  HYZAAR  Take 1 tablet by mouth daily.     pravastatin 20 MG tablet  Commonly known as:  PRAVACHOL  Take 20 mg by mouth daily.     predniSONE 10 MG tablet  Commonly known as:  DELTASONE  3 tabs x 3 days, 2 tabs x 3 days, 1 tab x 3 days     ranitidine 75 MG tablet  Commonly known as:  ZANTAC  Take 75 mg by mouth 2 (two) times daily.     vitamin C 500 MG tablet  Commonly known as:  ASCORBIC ACID  Take 500 mg by mouth daily.           Objective:   Physical Exam BP 110/68  Pulse 100  Temp(Src) 98 F (36.7 C)  Wt 182 lb (82.555 kg)  SpO2 98% General -- alert, well-developed, NAD.  HEENT-- Not pale. TMs bulge L>R, L one is slt red, no d/c Throat symmetric, no redness or discharge. Face symmetric, sinuses TTP on the left. Nose slt congested. Lungs -- normal respiratory effort, no intercostal retractions, no accessory muscle use,  and normal breath sounds.  Heart-- normal rate, regular rhythm, no murmur.   Neurologic--  alert & oriented X3. Speech normal, gait normal, strength normal in all extremities.  EOMI  Psych-- Cognition and judgment appear intact. Cooperative with normal attention span and concentration. No anxious or depressed appearing.    Assessment & Plan:   Sinusitis The patient has sinusitis and clinical grounds, she says status post Biaxin and continue with symptoms. Plan: Doxycycline, prednisone, see instructions  Allergies, severe this  year, refer to Dr. Maple HudsonYoung.

## 2013-07-31 NOTE — Patient Instructions (Signed)
Take prednisone and the antibiotic doxycycline as prescribed  Continue with Flonase and Claritin OTC Continue with albuterol as needed  Try Mucinex DM OTC : one tablet twice a day as needed for  Cough   Call if not improving in few days or if you get worse.  We are referring you to the allergist

## 2013-08-29 ENCOUNTER — Telehealth: Payer: Self-pay | Admitting: General Practice

## 2013-08-29 MED ORDER — BACLOFEN 10 MG PO TABS: 10.0000 mg | ORAL_TABLET | Freq: Every day | ORAL | Status: DC

## 2013-08-29 NOTE — Telephone Encounter (Signed)
Received a fax from [facility] for Baclofen refill. Per pt she takes this for fibromyalgia. She takes 1-2 pills daily depending on the flare ups. Ok to send 90 day supply to [facility]. New pt appt next month.

## 2013-08-29 NOTE — Telephone Encounter (Signed)
Med filled.  

## 2013-08-29 NOTE — Telephone Encounter (Signed)
Ok for #180, no refills

## 2013-09-10 ENCOUNTER — Telehealth: Payer: Self-pay

## 2013-09-10 NOTE — Telephone Encounter (Signed)
Medication and allergies:  Reviewed and updated  90 day supply/mail order: [facility] - [place], [place] - [address] Local pharmacy:  [id number] - [place], [place] - [address]   Immunizations due:  PNA and Shingles   A/P: No changes to personal, family history or past surgical hx PAP- no longer receive; hx. Hysterectomy  [facility]- 2006--normal per patient--[facility]- 11/15/12- BI-RAD-negative Flu- did not receive Tdap- 5 years ago per patient PNA- DUE Shingles- DUE  To Discuss with Provider: Baclofen is not effective.  Up dose?   Change medication?

## 2013-09-11 ENCOUNTER — Encounter: Payer: Self-pay | Admitting: Family Medicine

## 2013-09-11 ENCOUNTER — Ambulatory Visit (INDEPENDENT_AMBULATORY_CARE_PROVIDER_SITE_OTHER): Payer: Medicare Other | Admitting: Family Medicine

## 2013-09-11 VITALS — BP 110/70 | HR 94 | Temp 98.6°F | Resp 16 | Ht 61.0 in | Wt 180.5 lb

## 2013-09-11 DIAGNOSIS — N183 Chronic kidney disease, stage 3 unspecified: Secondary | ICD-10-CM

## 2013-09-11 DIAGNOSIS — I10 Essential (primary) hypertension: Secondary | ICD-10-CM

## 2013-09-11 DIAGNOSIS — E785 Hyperlipidemia, unspecified: Secondary | ICD-10-CM

## 2013-09-11 DIAGNOSIS — E119 Type 2 diabetes mellitus without complications: Secondary | ICD-10-CM

## 2013-09-11 DIAGNOSIS — E041 Nontoxic single thyroid nodule: Secondary | ICD-10-CM | POA: Insufficient documentation

## 2013-09-11 DIAGNOSIS — M797 Fibromyalgia: Secondary | ICD-10-CM

## 2013-09-11 DIAGNOSIS — IMO0001 Reserved for inherently not codable concepts without codable children: Secondary | ICD-10-CM

## 2013-09-11 MED ORDER — DULOXETINE HCL 30 MG PO CPEP: 30.0000 mg | ORAL_CAPSULE | Freq: Every day | ORAL | Status: DC

## 2013-09-11 NOTE — Assessment & Plan Note (Signed)
New to provider, ongoing for pt.  Not currently on daily preventative med- will start Cymbalta due to her gabapentin intolerance.  Will follow.

## 2013-09-11 NOTE — Progress Notes (Signed)
Pre visit review using our clinic review tool, if applicable. No additional management support is needed unless otherwise documented below in the visit note. 

## 2013-09-11 NOTE — Patient Instructions (Signed)
Schedule your complete physical in 6 months We'll notify you of your lab results and make any changes if needed Start the Cymbalta daily for the fibro During your next flare, try 1 of the tizanidine and see if that works better than the baclofen We'll call you with your thyroid US appt Call with any questions or concerns Welcome!  We're glad to have you!

## 2013-09-11 NOTE — Assessment & Plan Note (Signed)
New to provider, last [place] was 2 yrs ago.  Will repeat imaging to assess stability.  If no change, will no longer image.  Pt expressed understanding and is in agreement w/ plan.

## 2013-09-11 NOTE — Assessment & Plan Note (Signed)
New to provider, ongoing for pt.  Well controlled today.  Asymptomatic.  Check labs.  No anticipated med changes. 

## 2013-09-11 NOTE — Assessment & Plan Note (Signed)
This was in pt's records from previous MD but pt doesn't ever recall being told this.  Check BMP to assess if this is actual problem.

## 2013-09-11 NOTE — Progress Notes (Signed)
   Subjective:    Patient ID: [name], female    DOB: Jun 29, 1945, 68 y.o.   MRN: [id number]  HPI New to establish.  Previous MD- Kirby Funk and then Michiel Cowboy [facility])  DM- was told by MD at [facility] that she did not need meds and that w/ diet and exercise she wouldn't need to worry about.  Eating low carb diet, exercising regularly.  Last A1C was 6.4 ~6 months ago.  HTN- chronic problem, on Losartan HCTZ.  No CP, SOB, HAs, visual changes, edema.  Hyperlipidemia- chronic problem, on Pravastatin.  Denies abd pain, NV.  Chronic renal insufficiency- was was reported on pt's records from [place].  Pt has not been told this and last Cr on record (02/11/13) was 0.9  Thyroid nodules- chronic problem, last [place] 10/19/11 showed stable nodule in R upper pole.  Pt interested in repeat US to assess for growth or change.  Fibro- chronic problem, pt on Baclofen but reports this is ineffective.  Was told to take Gabapentin 100mg  TID and she is unable to take this due to cognitive issues on medication.  Will take 1 pill when she gets home from work and 1 pill prior to bed.   Review of Systems For ROS see HPI     Objective:   Physical Exam  Vitals reviewed. Constitutional: She is oriented to person, place, and time. She appears well-developed and well-nourished. No distress.  HENT:  Head: Normocephalic and atraumatic.  Eyes: Conjunctivae and EOM are normal. Pupils are equal, round, and reactive to light.  Neck: Normal range of motion. Neck supple. No thyromegaly present.  Cardiovascular: Normal rate, regular rhythm, normal heart sounds and intact distal pulses.   No murmur heard. Pulmonary/Chest: Effort normal and breath sounds normal. No respiratory distress.  Abdominal: Soft. She exhibits no distension. There is no tenderness.  Musculoskeletal: She exhibits no edema.  Lymphadenopathy:    She has no cervical adenopathy.  Neurological: She is alert and oriented to person, place,  and time.  Skin: Skin is warm and dry.  Psychiatric: She has a normal mood and affect. Her behavior is normal.          Assessment & Plan:

## 2013-09-11 NOTE — Assessment & Plan Note (Signed)
New to provider, pt was told that she was managing well w/ diet and exercise and has not been on meds.  Check labs.  Will determine next steps based on lab results

## 2013-09-11 NOTE — Assessment & Plan Note (Signed)
Chronic problem.  Tolerating statin w/o difficulty.  Check labs.  Adjust meds prn  

## 2013-09-12 ENCOUNTER — Institutional Professional Consult (permissible substitution): Payer: Medicare Other | Admitting: Internal Medicine

## 2013-09-12 LAB — CBC WITH DIFFERENTIAL/PLATELET
BASOS ABS: 0.1 10*3/uL (ref 0.0–0.1)
BASOS PCT: 0.9 % (ref 0.0–3.0)
EOS ABS: 0.1 10*3/uL (ref 0.0–0.7)
Eosinophils Relative: 1.8 % (ref 0.0–5.0)
HEMATOCRIT: 37.6 % (ref 36.0–46.0)
HEMOGLOBIN: 12.5 g/dL (ref 12.0–15.0)
LYMPHS ABS: 2.3 10*3/uL (ref 0.7–4.0)
LYMPHS PCT: 30.7 % (ref 12.0–46.0)
MCHC: 33.1 g/dL (ref 30.0–36.0)
MCV: 89.8 fl (ref 78.0–100.0)
MONOS PCT: 6.1 % (ref 3.0–12.0)
Monocytes Absolute: 0.5 10*3/uL (ref 0.1–1.0)
NEUTROS ABS: 4.5 10*3/uL (ref 1.4–7.7)
Neutrophils Relative %: 60.5 % (ref 43.0–77.0)
Platelets: 310 10*3/uL (ref 150.0–400.0)
RBC: 4.19 Mil/uL (ref 3.87–5.11)
RDW: 13.6 % (ref 11.5–15.5)
WBC: 7.5 10*3/uL (ref 4.0–10.5)

## 2013-09-12 LAB — HEPATIC FUNCTION PANEL
ALK PHOS: 47 U/L (ref 39–117)
ALT: 23 U/L (ref 0–35)
AST: 27 U/L (ref 0–37)
Albumin: 4.1 g/dL (ref 3.5–5.2)
BILIRUBIN DIRECT: 0 mg/dL (ref 0.0–0.3)
Total Bilirubin: 0.3 mg/dL (ref 0.2–1.2)
Total Protein: 7.2 g/dL (ref 6.0–8.3)

## 2013-09-12 LAB — BASIC METABOLIC PANEL
BUN: 12 mg/dL (ref 6–23)
CALCIUM: 10 mg/dL (ref 8.4–10.5)
CHLORIDE: 96 meq/L (ref 96–112)
CO2: 29 meq/L (ref 19–32)
CREATININE: 0.8 mg/dL (ref 0.4–1.2)
GFR: 73.14 mL/min (ref >60.00)
GLUCOSE: 87 mg/dL (ref 70–99)
Potassium: 3.9 mEq/L (ref 3.5–5.1)
Sodium: 134 mEq/L — ABNORMAL LOW (ref 135–145)

## 2013-09-12 LAB — LIPID PANEL
CHOLESTEROL: 207 mg/dL — AB (ref 0–200)
HDL: 41.9 mg/dL (ref >39.00)
LDL Cholesterol: 115 mg/dL — ABNORMAL HIGH (ref 0–99)
NonHDL: 165.1
TRIGLYCERIDES: 251 mg/dL — AB (ref 0.0–149.0)
Total CHOL/HDL Ratio: 5
VLDL: 50.2 mg/dL — ABNORMAL HIGH (ref 0.0–40.0)

## 2013-09-12 LAB — TSH: TSH: 0.47 u[IU]/mL (ref 0.35–4.50)

## 2013-09-12 LAB — HEMOGLOBIN A1C: HEMOGLOBIN A1C: 6.7 % — AB (ref 4.6–6.5)

## 2013-09-15 ENCOUNTER — Ambulatory Visit (HOSPITAL_BASED_OUTPATIENT_CLINIC_OR_DEPARTMENT_OTHER)
Admission: RE | Admit: 2013-09-15 | Discharge: 2013-09-15 | Disposition: A | Discharge status: Home / Self Care | Payer: Medicare Other | Source: Ambulatory Visit | Attending: Family Medicine | Admitting: Family Medicine

## 2013-09-15 ENCOUNTER — Encounter: Payer: Self-pay | Admitting: Family Medicine

## 2013-09-15 DIAGNOSIS — E042 Nontoxic multinodular goiter: Secondary | ICD-10-CM | POA: Insufficient documentation

## 2013-09-15 DIAGNOSIS — E041 Nontoxic single thyroid nodule: Secondary | ICD-10-CM

## 2013-09-29 ENCOUNTER — Ambulatory Visit: Payer: Medicare Other | Admitting: Family Medicine

## 2013-10-09 ENCOUNTER — Institutional Professional Consult (permissible substitution): Payer: Medicare Other | Admitting: Internal Medicine

## 2013-10-21 ENCOUNTER — Other Ambulatory Visit: Payer: Self-pay

## 2013-10-21 DIAGNOSIS — Z1231 Encounter for screening mammogram for malignant neoplasm of breast: Secondary | ICD-10-CM

## 2013-11-21 ENCOUNTER — Ambulatory Visit
Admission: RE | Admit: 2013-11-21 | Discharge: 2013-11-21 | Disposition: A | Discharge status: Home / Self Care | Payer: Medicare Other | Source: Ambulatory Visit

## 2013-11-21 DIAGNOSIS — Z1231 Encounter for screening mammogram for malignant neoplasm of breast: Secondary | ICD-10-CM

## 2014-02-03 ENCOUNTER — Encounter: Payer: Self-pay | Admitting: Physician Assistant

## 2014-02-03 ENCOUNTER — Ambulatory Visit (INDEPENDENT_AMBULATORY_CARE_PROVIDER_SITE_OTHER): Payer: Medicare Other | Admitting: Physician Assistant

## 2014-02-03 VITALS — BP 122/73 | HR 86 | Temp 97.6°F | Resp 16 | Ht 61.0 in | Wt 180.0 lb

## 2014-02-03 DIAGNOSIS — N951 Menopausal and female climacteric states: Secondary | ICD-10-CM

## 2014-02-03 DIAGNOSIS — R399 Unspecified symptoms and signs involving the genitourinary system: Secondary | ICD-10-CM

## 2014-02-03 DIAGNOSIS — N3000 Acute cystitis without hematuria: Secondary | ICD-10-CM

## 2014-02-03 MED ORDER — ESTROGENS, CONJUGATED 0.625 MG/GM VA CREA: 1.0000 | TOPICAL_CREAM | Freq: Every day | VAGINAL | Status: DC

## 2014-02-03 MED ORDER — CIPROFLOXACIN HCL 250 MG PO TABS: 250.0000 mg | ORAL_TABLET | Freq: Two times a day (BID) | ORAL | Status: DC

## 2014-02-03 MED ORDER — ESTROGENS, CONJUGATED 0.625 MG/GM VA CREA: 1.0000 | TOPICAL_CREAM | VAGINAL | Status: DC

## 2014-02-03 NOTE — Progress Notes (Signed)
Pre visit review using our clinic review tool, if applicable. No additional management support is needed unless otherwise documented below in the visit note/SLS  

## 2014-02-03 NOTE — Progress Notes (Signed)
ZOX:[id number]:Katherine Beverely Lowabori, MD Chief Complaint  Patient presents with  . Urinary Tract Infection    Pt c/o urinary frequency & burning with urination x5 days    Current Issues:  Presents with 5 days of dysuria, urinary urgency and urinary frequency Associated symptoms include:  cloudy urine and lower abdominal pain  There is a previous history of of similar symptoms. Sexually active:  Yes with female.   No concern for STI.  Prior to Admission medications   Medication Sig Start Date End Date Taking? Authorizing Provider  albuterol (PROVENTIL HFA;VENTOLIN HFA) 108 (90 BASE) MCG/ACT inhaler Inhale into the lungs every 6 (six) hours as needed for wheezing or shortness of breath.   Yes Historical Provider, MD  baclofen (LIORESAL) 10 MG tablet Take 1 tablet (10 mg total) by mouth daily. 08/29/13  Yes Sheliah HatchKatherine E Tabori, MD  DULoxetine (CYMBALTA) 30 MG capsule Take 1 capsule (30 mg total) by mouth daily. 09/11/13  Yes Sheliah HatchKatherine E Tabori, MD  fluticasone (FLONASE) 50 MCG/ACT nasal spray Place 2 sprays into both nostrils daily.   Yes Historical Provider, MD  gabapentin (NEURONTIN) 100 MG capsule Take 100 mg by mouth 3 (three) times daily.   Yes Historical Provider, MD  losartan-hydrochlorothiazide (HYZAAR) 100-25 MG per tablet Take 1 tablet by mouth daily.   Yes Historical Provider, MD  pravastatin (PRAVACHOL) 20 MG tablet Take 20 mg by mouth daily.   Yes Historical Provider, MD  ranitidine (ZANTAC) 75 MG tablet Take 75 mg by mouth 2 (two) times daily.   Yes Historical Provider, MD  vitamin C (ASCORBIC ACID) 500 MG tablet Take 500 mg by mouth daily.   Yes Historical Provider, MD  ciprofloxacin (CIPRO) 250 MG tablet Take 1 tablet (250 mg total) by mouth 2 (two) times daily. 02/03/14   Waldon MerlWilliam C Michele Judy, PA-C  conjugated estrogens (PREMARIN) vaginal cream Place 1 Applicatorful vaginally 2 (two) times a week. 02/03/14   Waldon MerlWilliam C Jaleigha Deane, PA-C    Review of Systems:See HPI.  All other ROS are negative.  PE:   BP 122/73 mmHg  Pulse 86  Temp(Src) 97.6 F (36.4 C) (Oral)  Resp 16  Ht 5\' 1"  (1.549 m)  Wt 180 lb (81.647 kg)  BMI 34.03 kg/m2  SpO2 99%  General appearance: alert, cooperative, appears stated age and no distress Lungs: clear to auscultation bilaterally Heart: regular rate and rhythm, S1, S2 normal, no murmur, click, rub or gallop Abdomen: soft, non-tender; bowel sounds normal; no masses,  no organomegaly   Results for orders placed or performed in visit on 09/11/13  Hemoglobin A1c  Result Value Ref Range   Hgb A1c MFr Bld 6.7 (H) 4.6 - 6.5 %  Lipid panel  Result Value Ref Range   Cholesterol 207 (H) 0 - 200 mg/dL   Triglycerides 811.9251.0 (H) 0.0 - 149.0 mg/dL   HDL 14.7841.90 >29.56>39.00 mg/dL   VLDL 21.350.2 (H) 0.0 - 08.640.0 mg/dL   LDL Cholesterol 578115 (H) 0 - 99 mg/dL   Total CHOL/HDL Ratio 5    NonHDL 165.10   Basic metabolic panel  Result Value Ref Range   Sodium 134 (L) 135 - 145 mEq/L   Potassium 3.9 3.5 - 5.1 mEq/L   Chloride 96 96 - 112 mEq/L   CO2 29 19 - 32 mEq/L   Glucose, Bld 87 70 - 99 mg/dL   BUN 12 6 - 23 mg/dL   Creatinine, Ser 0.8 0.4 - 1.2 mg/dL   Calcium 46.910.0 8.4 - 62.910.5 mg/dL  GFR 73.14 >60.00 mL/min  TSH  Result Value Ref Range   TSH 0.47 0.35 - 4.50 uIU/mL  Hepatic function panel  Result Value Ref Range   Total Bilirubin 0.3 0.2 - 1.2 mg/dL   Bilirubin, Direct 0.0 0.0 - 0.3 mg/dL   Alkaline Phosphatase 47 39 - 117 U/L   AST 27 0 - 37 U/L   ALT 23 0 - 35 U/L   Total Protein 7.2 6.0 - 8.3 g/dL   Albumin 4.1 3.5 - 5.2 g/dL  CBC with Differential  Result Value Ref Range   WBC 7.5 4.0 - 10.5 K/uL   RBC 4.19 3.87 - 5.11 Mil/uL   Hemoglobin 12.5 12.0 - 15.0 g/dL   HCT 16.137.6 09.636.0 - 04.546.0 %   MCV 89.8 78.0 - 100.0 fl   MCHC 33.1 30.0 - 36.0 g/dL   RDW 40.913.6 81.111.5 - 91.415.5 %   Platelets 310.0 150.0 - 400.0 K/uL   Neutrophils Relative % 60.5 43.0 - 77.0 %   Lymphocytes Relative 30.7 12.0 - 46.0 %   Monocytes Relative 6.1 3.0 - 12.0 %   Eosinophils Relative 1.8  0.0 - 5.0 %   Basophils Relative 0.9 0.0 - 3.0 %   Neutro Abs 4.5 1.4 - 7.7 K/uL   Lymphs Abs 2.3 0.7 - 4.0 K/uL   Monocytes Absolute 0.5 0.1 - 1.0 K/uL   Eosinophils Absolute 0.1 0.0 - 0.7 K/uL   Basophils Absolute 0.1 0.0 - 0.1 K/uL    Assessment and Plan:  1. UTI symptoms Giving history, will empirically treat with Cipro while culture is pending.  Increase fluids. Daily probiotic. - POCT urinalysis dipstick; Standing - ciprofloxacin (CIPRO) 250 MG tablet; Take 1 tablet (250 mg total) by mouth 2 (two) times daily.  Dispense: 6 tablet; Refill: 0 - Urine culture  2. Menopausal vaginal dryness Giving recurrence of UTIs, c/o vaginal dryness and postmenopausal status, will begin vaginal estrogen.  There is no documented or patient-endorsed history of malignany.  Mammogram up-to-date. - conjugated estrogens (PREMARIN) vaginal cream; Place 1 Applicatorful vaginally 2 (two) times a week.  Dispense: 42.5 g; Refill: 12 Follow-up with PCP in 1 month.

## 2014-02-03 NOTE — Progress Notes (Deleted)
Patient presents to clinic today c/o ***.   Past Medical History  Diagnosis Date  . Fibromyalgia   . Arthritis   . GERD (gastroesophageal reflux disease)   . Hyperlipidemia   . History of Legionnaire's disease 2006    was in hospital 17 days-has some scar tissue lungs  . Hypertension   . Asthma     hx scar tissue lung-only occ bronchitis  . Hard of hearing   . Chronic kidney disease   . Diabetes mellitus without complication   . Obesity   . Pityriasis rosea     Current Outpatient Prescriptions on File Prior to Visit  Medication Sig Dispense Refill  . albuterol (PROVENTIL HFA;VENTOLIN HFA) 108 (90 BASE) MCG/ACT inhaler Inhale into the lungs every 6 (six) hours as needed for wheezing or shortness of breath.    . baclofen (LIORESAL) 10 MG tablet Take 1 tablet (10 mg total) by mouth daily. 180 tablet 0  . DULoxetine (CYMBALTA) 30 MG capsule Take 1 capsule (30 mg total) by mouth daily. 30 capsule 3  . fluticasone (FLONASE) 50 MCG/ACT nasal spray Place 2 sprays into both nostrils daily.    Marland Kitchen gabapentin (NEURONTIN) 100 MG capsule Take 100 mg by mouth 3 (three) times daily.    Marland Kitchen losartan-hydrochlorothiazide (HYZAAR) 100-25 MG per tablet Take 1 tablet by mouth daily.    . pravastatin (PRAVACHOL) 20 MG tablet Take 20 mg by mouth daily.    . ranitidine (ZANTAC) 75 MG tablet Take 75 mg by mouth 2 (two) times daily.    . vitamin C (ASCORBIC ACID) 500 MG tablet Take 500 mg by mouth daily.     No current facility-administered medications on file prior to visit.    Allergies  Allergen Reactions  . Amoxicillin Anaphylaxis  . Codeine Shortness Of Breath  . Cymbalta [Duloxetine Hcl] Hives  . Penicillins Anaphylaxis  . Sulfa Antibiotics Shortness Of Breath  . Tizanidine     Family History  Problem Relation Age of Onset  . Heart disease Mother   . Cancer Father     malignant neoplasm  . Heart disease Sister   . Kidney disease Sister     renal disease    History   Social  History  . Marital Status: Married    Spouse Name: N/A    Number of Children: N/A  . Years of Education: N/A   Social History Main Topics  . Smoking status: Former Smoker    Quit date: 07/19/2008  . Smokeless tobacco: None  . Alcohol Use: Yes     Comment: occ  . Drug Use: No  . Sexual Activity: None   Other Topics Concern  . None   Social History Narrative    Review of Systems - See HPI.  All other ROS are negative.  BP 122/73 mmHg  Pulse 86  Temp(Src) 97.6 F (36.4 C) (Oral)  Resp 16  Ht 5\' 1"  (1.549 m)  Wt 180 lb (81.647 kg)  BMI 34.03 kg/m2  SpO2 99%  Physical Exam  No results found for this or any previous visit (from the past 2160 hour(s)).  Assessment/Plan: No problem-specific assessment & plan notes found for this encounter.     BJY:[id number] Beverely Low, MD Chief Complaint  Patient presents with  . Urinary Tract Infection    Pt c/o urinary frequency & burning with urination x5 days   Current Issues:  Presents with 5 days of dysuria, urinary urgency and urinary frequency Associated symptoms include:  cloudy  urine and fatigue There is a previous history of of similar symptoms. Sexually active:  Yes with female.   No concern for STI.  Prior to Admission medications   Medication Sig Start Date End Date Taking? Authorizing Provider  albuterol (PROVENTIL HFA;VENTOLIN HFA) 108 (90 BASE) MCG/ACT inhaler Inhale into the lungs every 6 (six) hours as needed for wheezing or shortness of breath.   Yes Historical Provider, MD  baclofen (LIORESAL) 10 MG tablet Take 1 tablet (10 mg total) by mouth daily. 08/29/13  Yes Sheliah HatchKatherine E Tabori, MD  DULoxetine (CYMBALTA) 30 MG capsule Take 1 capsule (30 mg total) by mouth daily. 09/11/13  Yes Sheliah HatchKatherine E Tabori, MD  fluticasone (FLONASE) 50 MCG/ACT nasal spray Place 2 sprays into both nostrils daily.   Yes Historical Provider, MD  gabapentin (NEURONTIN) 100 MG capsule Take 100 mg by mouth 3 (three) times daily.   Yes Historical  Provider, MD  losartan-hydrochlorothiazide (HYZAAR) 100-25 MG per tablet Take 1 tablet by mouth daily.   Yes Historical Provider, MD  pravastatin (PRAVACHOL) 20 MG tablet Take 20 mg by mouth daily.   Yes Historical Provider, MD  ranitidine (ZANTAC) 75 MG tablet Take 75 mg by mouth 2 (two) times daily.   Yes Historical Provider, MD  vitamin C (ASCORBIC ACID) 500 MG tablet Take 500 mg by mouth daily.   Yes Historical Provider, MD    Review of Systems: See HPI.  All other ROS negative.  PE:  BP 122/73 mmHg  Pulse 86  Temp(Src) 97.6 F (36.4 C) (Oral)  Resp 16  Ht 5\' 1"  (1.549 m)  Wt 180 lb (81.647 kg)  BMI 34.03 kg/m2  SpO2 99% Constitutional*** Heart*** Lungs*** Back*** Abdomen*** Pelvic***  Results for orders placed or performed in visit on 09/11/13  Hemoglobin A1c  Result Value Ref Range   Hgb A1c MFr Bld 6.7 (H) 4.6 - 6.5 %  Lipid panel  Result Value Ref Range   Cholesterol 207 (H) 0 - 200 mg/dL   Triglycerides 960.4251.0 (H) 0.0 - 149.0 mg/dL   HDL 54.0941.90 >81.19>39.00 mg/dL   VLDL 14.750.2 (H) 0.0 - 82.940.0 mg/dL   LDL Cholesterol 562115 (H) 0 - 99 mg/dL   Total CHOL/HDL Ratio 5    NonHDL 165.10   Basic metabolic panel  Result Value Ref Range   Sodium 134 (L) 135 - 145 mEq/L   Potassium 3.9 3.5 - 5.1 mEq/L   Chloride 96 96 - 112 mEq/L   CO2 29 19 - 32 mEq/L   Glucose, Bld 87 70 - 99 mg/dL   BUN 12 6 - 23 mg/dL   Creatinine, Ser 0.8 0.4 - 1.2 mg/dL   Calcium 13.010.0 8.4 - 86.510.5 mg/dL   GFR 78.4673.14 >96.29>60.00 mL/min  TSH  Result Value Ref Range   TSH 0.47 0.35 - 4.50 uIU/mL  Hepatic function panel  Result Value Ref Range   Total Bilirubin 0.3 0.2 - 1.2 mg/dL   Bilirubin, Direct 0.0 0.0 - 0.3 mg/dL   Alkaline Phosphatase 47 39 - 117 U/L   AST 27 0 - 37 U/L   ALT 23 0 - 35 U/L   Total Protein 7.2 6.0 - 8.3 g/dL   Albumin 4.1 3.5 - 5.2 g/dL  CBC with Differential  Result Value Ref Range   WBC 7.5 4.0 - 10.5 K/uL   RBC 4.19 3.87 - 5.11 Mil/uL   Hemoglobin 12.5 12.0 - 15.0 g/dL   HCT  52.837.6 41.336.0 - 24.446.0 %   MCV 89.8 78.0 -  100.0 fl   MCHC 33.1 30.0 - 36.0 g/dL   RDW 28.413.6 13.211.5 - 44.015.5 %   Platelets 310.0 150.0 - 400.0 K/uL   Neutrophils Relative % 60.5 43.0 - 77.0 %   Lymphocytes Relative 30.7 12.0 - 46.0 %   Monocytes Relative 6.1 3.0 - 12.0 %   Eosinophils Relative 1.8 0.0 - 5.0 %   Basophils Relative 0.9 0.0 - 3.0 %   Neutro Abs 4.5 1.4 - 7.7 K/uL   Lymphs Abs 2.3 0.7 - 4.0 K/uL   Monocytes Absolute 0.5 0.1 - 1.0 K/uL   Eosinophils Absolute 0.1 0.0 - 0.7 K/uL   Basophils Absolute 0.1 0.0 - 0.1 K/uL    Assessment and Plan:  1. UTI symptoms *** - POCT urinalysis dipstick; Standing - POCT urinalysis dipstick

## 2014-02-03 NOTE — Patient Instructions (Signed)
Your symptoms are consistent with a bladder infection, also called acute cystitis. Please take your antibiotic (Cipro) as directed until all pills are gone.  Stay very well hydrated.  Consider a daily probiotic (Align, Culturelle, or Activia) to help prevent stomach upset caused by the antibiotic.  Taking a probiotic daily may also help prevent recurrent UTIs.  Also consider taking AZO (Phenazopyridine) tablets to help decrease pain with urination.  Begin Premarin as directed.  I will call you with your urine testing results.  We will change antibiotics if indicated.  Call or return to clinic if symptoms are not resolved by completion of antibiotic.   Urinary Tract Infection A urinary tract infection (UTI) can occur any place along the urinary tract. The tract includes the kidneys, ureters, bladder, and urethra. A type of germ called bacteria often causes a UTI. UTIs are often helped with antibiotic medicine.  HOME CARE   If given, take antibiotics as told by your doctor. Finish them even if you start to feel better.  Drink enough fluids to keep your pee (urine) clear or pale yellow.  Avoid tea, drinks with caffeine, and bubbly (carbonated) drinks.  Pee often. Avoid holding your pee in for a long time.  Pee before and after having sex (intercourse).  Wipe from front to back after you poop (bowel movement) if you are a woman. Use each tissue only once. GET HELP RIGHT AWAY IF:   You have back pain.  You have lower belly (abdominal) pain.  You have chills.  You feel sick to your stomach (nauseous).  You throw up (vomit).  Your burning or discomfort with peeing does not go away.  You have a fever.  Your symptoms are not better in 3 days. MAKE SURE YOU:   Understand these instructions.  Will watch your condition.  Will get help right away if you are not doing well or get worse. Document Released: 09/06/2007 Document Revised: 12/13/2011 Document Reviewed: 10/19/2011 [facility]  Patient Information 2015 [place], [place]. This information is not intended to replace advice given to you by your health care provider. Make sure you discuss any questions you have with your health care provider.

## 2014-02-15 LAB — HM DIABETES EYE EXAM

## 2014-03-13 ENCOUNTER — Encounter: Payer: Self-pay | Admitting: Family Medicine

## 2014-03-13 ENCOUNTER — Ambulatory Visit (INDEPENDENT_AMBULATORY_CARE_PROVIDER_SITE_OTHER): Payer: Medicare Other | Admitting: Family Medicine

## 2014-03-13 ENCOUNTER — Other Ambulatory Visit: Payer: Self-pay | Admitting: General Practice

## 2014-03-13 VITALS — BP 118/78 | HR 100 | Temp 97.9°F | Resp 17 | Ht 62.0 in | Wt 181.1 lb

## 2014-03-13 DIAGNOSIS — Z23 Encounter for immunization: Secondary | ICD-10-CM

## 2014-03-13 DIAGNOSIS — Z0000 Encounter for general adult medical examination without abnormal findings: Secondary | ICD-10-CM

## 2014-03-13 DIAGNOSIS — E785 Hyperlipidemia, unspecified: Secondary | ICD-10-CM

## 2014-03-13 DIAGNOSIS — I10 Essential (primary) hypertension: Secondary | ICD-10-CM

## 2014-03-13 DIAGNOSIS — Z780 Asymptomatic menopausal state: Secondary | ICD-10-CM

## 2014-03-13 DIAGNOSIS — E1169 Type 2 diabetes mellitus with other specified complication: Secondary | ICD-10-CM

## 2014-03-13 DIAGNOSIS — E119 Type 2 diabetes mellitus without complications: Secondary | ICD-10-CM

## 2014-03-13 LAB — HEMOGLOBIN A1C: Hgb A1c MFr Bld: 6.9 % — ABNORMAL HIGH (ref 4.6–6.5)

## 2014-03-13 LAB — CBC WITH DIFFERENTIAL/PLATELET
BASOS PCT: 0.5 % (ref 0.0–3.0)
Basophils Absolute: 0 10*3/uL (ref 0.0–0.1)
EOS ABS: 0.2 10*3/uL (ref 0.0–0.7)
Eosinophils Relative: 3.1 % (ref 0.0–5.0)
HCT: 38.7 % (ref 36.0–46.0)
Hemoglobin: 12.8 g/dL (ref 12.0–15.0)
LYMPHS PCT: 28.5 % (ref 12.0–46.0)
Lymphs Abs: 1.7 10*3/uL (ref 0.7–4.0)
MCHC: 33 g/dL (ref 30.0–36.0)
MCV: 88.8 fl (ref 78.0–100.0)
Monocytes Absolute: 0.4 10*3/uL (ref 0.1–1.0)
Monocytes Relative: 6.1 % (ref 3.0–12.0)
Neutro Abs: 3.8 10*3/uL (ref 1.4–7.7)
Neutrophils Relative %: 61.8 % (ref 43.0–77.0)
Platelets: 316 10*3/uL (ref 150.0–400.0)
RBC: 4.36 Mil/uL (ref 3.87–5.11)
RDW: 13.8 % (ref 11.5–15.5)
WBC: 6.1 10*3/uL (ref 4.0–10.5)

## 2014-03-13 LAB — BASIC METABOLIC PANEL
BUN: 10 mg/dL (ref 6–23)
CO2: 27 meq/L (ref 19–32)
CREATININE: 0.8 mg/dL (ref 0.4–1.2)
Calcium: 9.2 mg/dL (ref 8.4–10.5)
Chloride: 100 mEq/L (ref 96–112)
GFR: 82.09 mL/min (ref >60.00)
Glucose, Bld: 114 mg/dL — ABNORMAL HIGH (ref 70–99)
Potassium: 4 mEq/L (ref 3.5–5.1)
Sodium: 134 mEq/L — ABNORMAL LOW (ref 135–145)

## 2014-03-13 LAB — HEPATIC FUNCTION PANEL
ALT: 20 U/L (ref 0–35)
AST: 22 U/L (ref 0–37)
Albumin: 4.1 g/dL (ref 3.5–5.2)
Alkaline Phosphatase: 49 U/L (ref 39–117)
Bilirubin, Direct: 0 mg/dL (ref 0.0–0.3)
Total Bilirubin: 0.5 mg/dL (ref 0.2–1.2)
Total Protein: 7 g/dL (ref 6.0–8.3)

## 2014-03-13 LAB — LIPID PANEL
Cholesterol: 200 mg/dL (ref 0–200)
HDL: 45.8 mg/dL (ref >39.00)
NonHDL: 154.2
TRIGLYCERIDES: 204 mg/dL — AB (ref 0.0–149.0)
Total CHOL/HDL Ratio: 4
VLDL: 40.8 mg/dL — ABNORMAL HIGH (ref 0.0–40.0)

## 2014-03-13 LAB — VITAMIN D 25 HYDROXY (VIT D DEFICIENCY, FRACTURES): VITD: 16.18 ng/mL — AB (ref 30.00–100.00)

## 2014-03-13 LAB — TSH: TSH: 0.61 u[IU]/mL (ref 0.35–4.50)

## 2014-03-13 LAB — LDL CHOLESTEROL, DIRECT: Direct LDL: 131 mg/dL

## 2014-03-13 MED ORDER — SIMVASTATIN 20 MG PO TABS: 20.0000 mg | ORAL_TABLET | Freq: Every day | ORAL | Status: DC

## 2014-03-13 MED ORDER — PRAVASTATIN SODIUM 20 MG PO TABS: 20.0000 mg | ORAL_TABLET | Freq: Every day | ORAL | Status: DC

## 2014-03-13 MED ORDER — VITAMIN D (ERGOCALCIFEROL) 1.25 MG (50000 UNIT) PO CAPS: 50000.0000 [IU] | ORAL_CAPSULE | ORAL | Status: DC

## 2014-03-13 NOTE — Assessment & Plan Note (Signed)
Chronic problem.  Well controlled.  Asymptomatic.  Check labs.  No anticipated med changes. 

## 2014-03-13 NOTE — Progress Notes (Signed)
   Subjective:    Patient ID: [name], female    DOB: [birth year], 68 y.o.   MRN: [id number]  HPI Here today for CPE.  Risk Factors: HTN- chronic problem, on Losartan HCTZ daily w/ good BP control. Hyperlipidemia- chronic problem, on Pravastatin. DM- chronic problem, diet controlled.  UTD on eye exam.  On ARB for renal protection. Physical Activity: Walking regularly Fall Risk: low Depression: denies current sxs Hearing: normal to conversational tones w/ hearing aides and decreased to whispered voice at 6 ft ADL's: independent Cognitive: normal linear thought process, memory and attention intact Home Safety: safe at home Height, Weight, BMI, Visual Acuity: see vitals, vision corrected to 20/20 w/ glasses Counseling: UTD on colonoscopy, mammo.  No need for paps.  [facility] Labs Ordered: See A&P Care Plan: See A&P    Review of Systems Patient reports no vision/ hearing changes, adenopathy,fever, weight change,  persistant/recurrent hoarseness , swallowing issues, chest pain, palpitations, edema, persistant/recurrent cough, hemoptysis, dyspnea (rest/exertional/paroxysmal nocturnal), gastrointestinal bleeding (melena, rectal bleeding), abdominal pain, significant heartburn, bowel changes, GU symptoms (dysuria, hematuria, incontinence), Gyn symptoms (abnormal  bleeding, pain),  syncope, focal weakness, memory loss, numbness & tingling, skin/hair/nail changes, abnormal bruising or bleeding, anxiety, or depression.     Objective:   Physical Exam General Appearance:    Alert, cooperative, no distress, appears stated age  Head:    Normocephalic, without obvious abnormality, atraumatic  Eyes:    PERRL, conjunctiva/corneas clear, EOM's intact, fundi    benign, both eyes  Ears:    Normal TM's and external ear canals, both ears  Nose:   Nares normal, septum midline, mucosa normal, no drainage    or sinus tenderness  Throat:   Lips, mucosa, and tongue normal; teeth and  gums normal  Neck:   Supple, symmetrical, trachea midline, no adenopathy;    Thyroid: no enlargement/tenderness/nodules  Back:     Symmetric, no curvature, ROM normal, no CVA tenderness  Lungs:     Clear to auscultation bilaterally, respirations unlabored  Chest Wall:    No tenderness or deformity   Heart:    Regular rate and rhythm, S1 and S2 normal, no murmur, rub   or gallop  Breast Exam:    Deferred to mammo  Abdomen:     Soft, non-tender, bowel sounds active all four quadrants,    no masses, no organomegaly  Genitalia:    Deferred  Rectal:    Extremities:   Extremities normal, atraumatic, no cyanosis or edema  Pulses:   2+ and symmetric all extremities  Skin:   Skin color, texture, turgor normal, no rashes or lesions  Lymph nodes:   Cervical, supraclavicular, and axillary nodes normal  Neurologic:   CNII-XII intact, normal strength, sensation and reflexes    throughout          Assessment & Plan:

## 2014-03-13 NOTE — Addendum Note (Signed)
Addended by: Jackson LatinoYLER, Henry Utsey L on: 03/13/2014 10:28 AM   Modules accepted: Orders

## 2014-03-13 NOTE — Assessment & Plan Note (Signed)
Chronic problem.  Tolerating statin w/o difficulty.  Check labs.  Adjust meds prn  

## 2014-03-13 NOTE — Progress Notes (Signed)
Pre visit review using our clinic review tool, if applicable. No additional management support is needed unless otherwise documented below in the visit note. 

## 2014-03-13 NOTE — Patient Instructions (Signed)
Follow up in 3-4 months to recheck diabetes We'll notify you of your lab results and make any changes if needed Keep up the good work on healthy diet and regular exercise- you look great! We'll call you with your bone density appt Call with any questions or concerns Altamese CabalMerry Christmas!!

## 2014-03-13 NOTE — Assessment & Plan Note (Signed)
Pt's PE WNL w/ exception of mild obesity.  UTD on mammo and colonoscopy.  DEXA ordered.  Written screening schedule updated and given to pt.  Check labs.  Anticipatory guidance provided.

## 2014-03-13 NOTE — Assessment & Plan Note (Signed)
Chronic problem.  Currently diet controlled.  UTD on eye exam.  On ARB for renal protection.  Check labs.  Start meds prn.

## 2014-04-14 ENCOUNTER — Other Ambulatory Visit: Payer: Medicare Other

## 2014-04-17 ENCOUNTER — Other Ambulatory Visit: Payer: Self-pay

## 2014-05-15 ENCOUNTER — Ambulatory Visit
Admission: RE | Admit: 2014-05-15 | Discharge: 2014-05-15 | Disposition: A | Discharge status: Home / Self Care | Payer: Medicare Other | Source: Ambulatory Visit | Attending: Family Medicine | Admitting: Family Medicine

## 2014-05-15 DIAGNOSIS — Z780 Asymptomatic menopausal state: Secondary | ICD-10-CM | POA: Diagnosis not present

## 2014-05-15 DIAGNOSIS — M810 Age-related osteoporosis without current pathological fracture: Secondary | ICD-10-CM | POA: Diagnosis not present

## 2014-05-18 ENCOUNTER — Encounter: Payer: Self-pay | Admitting: Family Medicine

## 2014-05-18 MED ORDER — ALENDRONATE SODIUM 70 MG PO TABS: 70.0000 mg | ORAL_TABLET | ORAL | Status: DC

## 2014-05-18 NOTE — Telephone Encounter (Signed)
Med filled, and pt notified.

## 2014-05-20 ENCOUNTER — Encounter: Payer: Self-pay | Admitting: Family Medicine

## 2014-06-29 ENCOUNTER — Encounter: Payer: Self-pay | Admitting: Family Medicine

## 2014-06-30 MED ORDER — LOSARTAN POTASSIUM-HCTZ 100-25 MG PO TABS: 1.0000 | ORAL_TABLET | Freq: Every day | ORAL | Status: DC

## 2014-06-30 NOTE — Telephone Encounter (Signed)
Med filled.  

## 2014-07-08 ENCOUNTER — Encounter: Payer: Self-pay | Admitting: Medical

## 2014-07-08 ENCOUNTER — Ambulatory Visit (INDEPENDENT_AMBULATORY_CARE_PROVIDER_SITE_OTHER): Payer: Medicare Other | Admitting: Medical

## 2014-07-08 ENCOUNTER — Encounter: Payer: Self-pay | Admitting: Family Medicine

## 2014-07-08 VITALS — BP 119/85 | HR 94 | Temp 97.9°F | Ht 62.0 in | Wt 185.2 lb

## 2014-07-08 DIAGNOSIS — J0101 Acute recurrent maxillary sinusitis: Secondary | ICD-10-CM | POA: Diagnosis not present

## 2014-07-08 DIAGNOSIS — J0100 Acute maxillary sinusitis, unspecified: Secondary | ICD-10-CM | POA: Insufficient documentation

## 2014-07-08 DIAGNOSIS — J301 Allergic rhinitis due to pollen: Secondary | ICD-10-CM | POA: Diagnosis not present

## 2014-07-08 DIAGNOSIS — J309 Allergic rhinitis, unspecified: Secondary | ICD-10-CM | POA: Insufficient documentation

## 2014-07-08 MED ORDER — METHYLPREDNISOLONE ACETATE 40 MG/ML IJ SUSP
40.0000 mg | Freq: Once | INTRAMUSCULAR | Status: AC
  Administered 2014-07-08: 40 mg via INTRAMUSCULAR

## 2014-07-08 MED ORDER — GABAPENTIN 100 MG PO CAPS: 100.0000 mg | ORAL_CAPSULE | Freq: Three times a day (TID) | ORAL | Status: DC

## 2014-07-08 MED ORDER — DOXYCYCLINE HYCLATE 100 MG PO TABS: 100.0000 mg | ORAL_TABLET | Freq: Two times a day (BID) | ORAL | Status: DC

## 2014-07-08 NOTE — Patient Instructions (Signed)
Allergic rhinitis Allergy symptoms recently despite use of claritin and flonase. Since moderate to severe nasal congestion will give depomedrol 40 mg im.   Sinusitis, acute maxillary Your appear to have a sinus infection. I am prescribing  Doxycycline antibiotic for the infection. Since I do think allergy component preceding infection and no response to allergy tx recently will add depomedrol. In event any worsening or persisting symptoms notify us.  Rest, hydrate, tylenol for fever.  Follow up in 7 days or as needed.

## 2014-07-08 NOTE — Assessment & Plan Note (Signed)
Allergy symptoms recently despite use of claritin and flonase. Since moderate to severe nasal congestion will give depomedrol 40 mg im.

## 2014-07-08 NOTE — Assessment & Plan Note (Signed)
Your appear to have a sinus infection. I am prescribing  Doxycycline antibiotic for the infection. Since I do think allergy component preceding infection and no response to allergy tx recently will add depomedrol. In event any worsening or persisting symptoms notify us.  Rest, hydrate, tylenol for fever.  Follow up in 7 days or as needed.

## 2014-07-08 NOTE — Progress Notes (Signed)
Subjective:    Patient ID: [name], female    DOB: 01-08-1946, 69 y.o.   MRN: [id number]  HPI  Pt has 4 days of sinus pressure. Mostly on left side. On light palpation the area will hurt. Pt has some water and itchy eyes. Some nasal congestion. Pt has known allergies in spring. She taking claritin otc. Taking only one time a day. Restarted claritin 3 wks ago. Pt has not tried any other otc antihistamines. Pt has flonase recently and has been using for 2 wks.   Pt a1- c was never above 7.    Review of Systems  Constitutional: Negative for fever, chills and fatigue.  HENT: Positive for congestion, postnasal drip, rhinorrhea and sinus pressure.   Eyes: Positive for itching. Negative for redness.       With watery eyes.  Respiratory: Negative for cough, choking, chest tightness, shortness of breath and wheezing.   Cardiovascular: Negative for chest pain and palpitations.  Musculoskeletal: Negative for back pain.  Neurological: Negative for dizziness and headaches.  Hematological: Negative for adenopathy. Does not bruise/bleed easily.  Psychiatric/Behavioral: Negative for behavioral problems and confusion.   Past Medical History  Diagnosis Date  . Fibromyalgia   . Arthritis   . GERD (gastroesophageal reflux disease)   . Hyperlipidemia   . History of Legionnaire's disease 2006    was in hospital 17 days-has some scar tissue lungs  . Hypertension   . Asthma     hx scar tissue lung-only occ bronchitis  . Hard of hearing   . Chronic kidney disease   . Diabetes mellitus without complication   . Obesity   . Pityriasis rosea     History   Social History  . Marital Status: Married    Spouse Name: N/A  . Number of Children: N/A  . Years of Education: N/A   Occupational History  . Not on file.   Social History Main Topics  . Smoking status: Former Smoker    Quit date: 07/19/2008  . Smokeless tobacco: Not on file  . Alcohol Use: Yes     Comment: occ  . Drug Use: No    . Sexual Activity: Not on file   Other Topics Concern  . Not on file   Social History Narrative    Past Surgical History  Procedure Laterality Date  . Wrist arthroscopy  2006    right  . Carpal tunnel release  2006    rt  . Nasal hemorrhage control  2009  . Stapedectomy      bilateral-wears hearing aids  . Cholecystectomy    . Abdominal hysterectomy    . Colonoscopy    . Carpal tunnel release  07/25/2011    Procedure: CARPAL TUNNEL RELEASE;  Surgeon: Wyn Forsterobert V Sypher Jr., MD;  Location: [facility];  Service: Orthopedics;  Laterality: Left;  . Appendectomy      Family History  Problem Relation Age of Onset  . Heart disease Mother   . Cancer Father     malignant neoplasm  . Heart disease Sister   . Kidney disease Sister     renal disease    Allergies  Allergen Reactions  . Amoxicillin Anaphylaxis  . Codeine Shortness Of Breath  . Cymbalta [Duloxetine Hcl] Hives  . Penicillins Anaphylaxis  . Sulfa Antibiotics Shortness Of Breath  . Tizanidine Shortness Of Breath    Current Outpatient Prescriptions on File Prior to Visit  Medication Sig Dispense Refill  . albuterol (PROVENTIL HFA;VENTOLIN  HFA) 108 (90 BASE) MCG/ACT inhaler Inhale into the lungs every 6 (six) hours as needed for wheezing or shortness of breath.    Marland Kitchen alendronate (FOSAMAX) 70 MG tablet Take 1 tablet (70 mg total) by mouth every 7 (seven) days. Take with a full glass of water on an empty stomach. 12 tablet 3  . conjugated estrogens (PREMARIN) vaginal cream Place 1 Applicatorful vaginally 2 (two) times a week. 42.5 g 12  . fluticasone (FLONASE) 50 MCG/ACT nasal spray Place 2 sprays into both nostrils daily.    Marland Kitchen gabapentin (NEURONTIN) 100 MG capsule Take 100 mg by mouth 3 (three) times daily.    Marland Kitchen losartan-hydrochlorothiazide (HYZAAR) 100-25 MG per tablet Take 1 tablet by mouth daily. 90 tablet 1  . ranitidine (ZANTAC) 75 MG tablet Take 75 mg by mouth 2 (two) times daily.    . simvastatin  (ZOCOR) 20 MG tablet Take 1 tablet (20 mg total) by mouth at bedtime. 90 tablet 1  . vitamin C (ASCORBIC ACID) 500 MG tablet Take 500 mg by mouth daily.    . Vitamin D, Ergocalciferol, (DRISDOL) 50000 UNITS CAPS capsule Take 1 capsule (50,000 Units total) by mouth every 7 (seven) days. 12 capsule 0   No current facility-administered medications on file prior to visit.    BP 119/85 mmHg  Pulse 94  Temp(Src) 97.9 F (36.6 C) (Oral)  Ht  (1.575 m)  Wt 185 lb 3.2 oz (84.006 kg)  BMI 33.86 kg/m2  SpO2 96%       Objective:   Physical Exam   General  Mental Status - Alert. General Appearance - Well groomed. Not in acute distress.  Skin Rashes- No Rashes.  HEENT Head- Normal. Ear Auditory Canal - Left- Normal. Right - Normal.Tympanic Membrane- Left- Normal. Right- Normal. Eye Sclera/Conjunctiva- Left- Normal. Right- Normal. Nose & Sinuses Nasal Mucosa- Left-  Boggy and Congested. Right-  Boggy and  Congested.lt side  maxillary and frontal sinus pressure. Mouth & Throat Lips: Upper Lip- Normal: no dryness, cracking, pallor, cyanosis, or vesicular eruption. Lower Lip-Normal: no dryness, cracking, pallor, cyanosis or vesicular eruption. Buccal Mucosa- Bilateral- No Aphthous ulcers. Oropharynx- No Discharge or Erythema. +pnd. Tonsils: Characteristics- Bilateral- No Erythema or Congestion. Size/Enlargement- Bilateral- No enlargement. Discharge- bilateral-None.  Neck Neck- Supple. No Masses.   Chest and Lung Exam Auscultation: Breath Sounds:-Clear even and unlabored.  Cardiovascular Auscultation:Rythm- Regular, rate and rhythm. Murmurs & Other Heart Sounds:Ausculatation of the heart reveal- No Murmurs.  Lymphatic Head & Neck General Head & Neck Lymphatics: Bilateral: Description- No Localized lymphadenopathy.      Assessment & Plan:

## 2014-07-08 NOTE — Progress Notes (Signed)
Pre visit review using our clinic review tool, if applicable. No additional management support is needed unless otherwise documented below in the visit note. 

## 2014-09-23 ENCOUNTER — Other Ambulatory Visit: Payer: Self-pay | Admitting: Family Medicine

## 2014-09-24 ENCOUNTER — Encounter: Payer: Self-pay | Admitting: General Practice

## 2014-09-24 NOTE — Telephone Encounter (Signed)
Med filled and mychart message sent to pt to make a diabetes/cholesterol follow up appointment.

## 2014-09-28 ENCOUNTER — Other Ambulatory Visit: Payer: Self-pay

## 2014-11-02 ENCOUNTER — Encounter: Payer: Self-pay | Admitting: Family Medicine

## 2014-11-03 ENCOUNTER — Ambulatory Visit: Payer: Medicare Other | Admitting: Internal Medicine

## 2014-11-05 ENCOUNTER — Telehealth: Payer: Self-pay | Admitting: General Practice

## 2014-11-05 NOTE — Telephone Encounter (Signed)
I called pt today in regards to a comment made by her daughter that pt was VERY upset with the office. I called pt and spoke with her in regards to this. On 11/02/14 I had received a Mychart message from pt advising that she could not get through on our phone lines. I sent this message to the schedulers to make pt an appointment to be seen. No one returned pt call.    Pt was finally able to get through on the phones late afternoon on Monday and told the person who answered she normally sees Dr. Beverely Low, but she was having breathing issues and thought she may need a chest x-ray, pt then asked if labs were to be drawn could she have her A1c completed so that she would have the results when she seen Tabori. Pt was placed on schedule for Dr. Drue Novel on Tuesday morning (appt was made for Diabetes follow up instead of Breathing issues).   Pt was very upset when she was called on Tuesday Morning before she left her house and was advised she would not be seeing Dr. Drue Novel as her diabetes was an ongoing issues and she would have to see her PCP. Pt upset because her appt was cancelled and rescheduled for Friday with Tabori, however no one ever adressed her breathing issues.   Per Pt she is doing better now, and will be ok until she sees Scientist, research (medical).

## 2014-11-06 ENCOUNTER — Ambulatory Visit (INDEPENDENT_AMBULATORY_CARE_PROVIDER_SITE_OTHER): Payer: Medicare Other | Admitting: Family Medicine

## 2014-11-06 ENCOUNTER — Ambulatory Visit (HOSPITAL_BASED_OUTPATIENT_CLINIC_OR_DEPARTMENT_OTHER)
Admission: RE | Admit: 2014-11-06 | Discharge: 2014-11-06 | Disposition: A | Discharge status: Home / Self Care | Payer: Medicare Other | Source: Ambulatory Visit | Attending: Family Medicine | Admitting: Family Medicine

## 2014-11-06 ENCOUNTER — Encounter: Payer: Self-pay | Admitting: Family Medicine

## 2014-11-06 VITALS — BP 132/68 | HR 93 | Temp 97.6°F | Resp 18 | Ht 62.0 in | Wt 187.4 lb

## 2014-11-06 DIAGNOSIS — R0789 Other chest pain: Secondary | ICD-10-CM | POA: Diagnosis not present

## 2014-11-06 DIAGNOSIS — R0602 Shortness of breath: Secondary | ICD-10-CM

## 2014-11-06 DIAGNOSIS — E119 Type 2 diabetes mellitus without complications: Secondary | ICD-10-CM | POA: Diagnosis not present

## 2014-11-06 DIAGNOSIS — E785 Hyperlipidemia, unspecified: Secondary | ICD-10-CM | POA: Diagnosis not present

## 2014-11-06 DIAGNOSIS — E1169 Type 2 diabetes mellitus with other specified complication: Secondary | ICD-10-CM

## 2014-11-06 DIAGNOSIS — J929 Pleural plaque without asbestos: Secondary | ICD-10-CM | POA: Diagnosis not present

## 2014-11-06 DIAGNOSIS — I10 Essential (primary) hypertension: Secondary | ICD-10-CM

## 2014-11-06 LAB — CBC WITH DIFFERENTIAL/PLATELET
BASOS ABS: 0 10*3/uL (ref 0.0–0.1)
Basophils Relative: 0.5 % (ref 0.0–3.0)
Eosinophils Absolute: 0.2 10*3/uL (ref 0.0–0.7)
Eosinophils Relative: 2.8 % (ref 0.0–5.0)
HCT: 37.2 % (ref 36.0–46.0)
Hemoglobin: 12.5 g/dL (ref 12.0–15.0)
LYMPHS PCT: 28.9 % (ref 12.0–46.0)
Lymphs Abs: 2 10*3/uL (ref 0.7–4.0)
MCHC: 33.7 g/dL (ref 30.0–36.0)
MCV: 90.1 fl (ref 78.0–100.0)
MONOS PCT: 6.7 % (ref 3.0–12.0)
Monocytes Absolute: 0.5 10*3/uL (ref 0.1–1.0)
Neutro Abs: 4.3 10*3/uL (ref 1.4–7.7)
Neutrophils Relative %: 61.1 % (ref 43.0–77.0)
Platelets: 334 10*3/uL (ref 150.0–400.0)
RBC: 4.13 Mil/uL (ref 3.87–5.11)
RDW: 13.2 % (ref 11.5–15.5)
WBC: 7 10*3/uL (ref 4.0–10.5)

## 2014-11-06 LAB — HEPATIC FUNCTION PANEL
ALBUMIN: 4.2 g/dL (ref 3.5–5.2)
ALT: 21 U/L (ref 0–35)
AST: 22 U/L (ref 0–37)
Alkaline Phosphatase: 59 U/L (ref 39–117)
BILIRUBIN TOTAL: 0.4 mg/dL (ref 0.2–1.2)
Bilirubin, Direct: 0 mg/dL (ref 0.0–0.3)
Total Protein: 7.1 g/dL (ref 6.0–8.3)

## 2014-11-06 LAB — BASIC METABOLIC PANEL
BUN: 10 mg/dL (ref 6–23)
CO2: 30 mEq/L (ref 19–32)
CREATININE: 0.78 mg/dL (ref 0.40–1.20)
Calcium: 9.8 mg/dL (ref 8.4–10.5)
Chloride: 96 mEq/L (ref 96–112)
GFR: 78.3 mL/min (ref >60.00)
Glucose, Bld: 117 mg/dL — ABNORMAL HIGH (ref 70–99)
POTASSIUM: 3.6 meq/L (ref 3.5–5.1)
Sodium: 135 mEq/L (ref 135–145)

## 2014-11-06 LAB — LIPID PANEL
Cholesterol: 164 mg/dL (ref 0–200)
HDL: 40.5 mg/dL (ref >39.00)
LDL Cholesterol: 92 mg/dL (ref 0–99)
NONHDL: 123.5
Total CHOL/HDL Ratio: 4
Triglycerides: 156 mg/dL — ABNORMAL HIGH (ref 0.0–149.0)
VLDL: 31.2 mg/dL (ref 0.0–40.0)

## 2014-11-06 LAB — TSH: TSH: 0.84 u[IU]/mL (ref 0.35–4.50)

## 2014-11-06 LAB — HEMOGLOBIN A1C: HEMOGLOBIN A1C: 6.5 % (ref 4.6–6.5)

## 2014-11-06 NOTE — Progress Notes (Signed)
Pre visit review using our clinic review tool, if applicable. No additional management support is needed unless otherwise documented below in the visit note. 

## 2014-11-06 NOTE — Progress Notes (Signed)
   Subjective:    Patient ID: [name], female    DOB: 1946/01/10, 69 y.o.   MRN: [id number]  HPI DM- chronic problem, diet controlled.  UTD on eye exam (due in Nov), UTD on foot exam.  On ARB for renal protection.  Denies numbness/tingling of hands/feet.  Walking regularly.  HTN- chronic problem, on Hyzaar.  Denies HAs, visual changes, edema.  Will have intermittent chest pressure occuring once daily- last night occurred before bed and w/o exertion.  Hyperlipidemia- chronic problem, on Simvastatin.  Denies abd pain, N/V, myalgias.  Shortness of breath- 'every now and then I feel like i'm not getting enough air'.  Pt walks daily.  'some morning it's not so bad, some mornings I'm panting'.  Pt has hx of Legionaires disease and has been told that she has a lot of scar tissue.  sxs started 2-3 months ago.  Taking allergy medication daily.  Rare cough.  No fevers.  Asking for CXR.   Review of Systems For ROS see HPI     Objective:   Physical Exam  Constitutional: She is oriented to person, place, and time. She appears well-developed and well-nourished. No distress.  HENT:  Head: Normocephalic and atraumatic.  Eyes: Conjunctivae and EOM are normal. Pupils are equal, round, and reactive to light.  Neck: Normal range of motion. Neck supple. No thyromegaly present.  Cardiovascular: Normal rate, regular rhythm, normal heart sounds and intact distal pulses.   No murmur heard. Pulmonary/Chest: Effort normal and breath sounds normal. No respiratory distress.  Abdominal: Soft. She exhibits no distension. There is no tenderness.  Musculoskeletal: She exhibits no edema.  Lymphadenopathy:    She has no cervical adenopathy.  Neurological: She is alert and oriented to person, place, and time.  Skin: Skin is warm and dry.  Psychiatric: She has a normal mood and affect. Her behavior is normal.  Vitals reviewed.         Assessment & Plan:

## 2014-11-06 NOTE — Patient Instructions (Signed)
Schedule your complete physical in 4 months We'll notify you of your lab results and make any changes if needed Go downstairs and get your chest xray We'll call you with your cardiology appt Continue to get regular exercise- you're doing great! Keep up the healthy food choices Call with any questions or concerns Hang in there! HAPPY EARLY BIRTHDAY!!!

## 2014-11-08 NOTE — Assessment & Plan Note (Signed)
Chronic problem.  Tolerating statin w/o difficulty.  Check labs.  Adjust meds prn  

## 2014-11-08 NOTE — Assessment & Plan Note (Signed)
Chronic problem.  Currently diet controlled.  UTD on eye and foot exams.  On ARB for renal protection.  Asymptomatic.  Check labs.  Start meds prn.

## 2014-11-08 NOTE — Assessment & Plan Note (Signed)
Chronic problem.  Adequate control.  Having some SOB and chest pressure- EKG WNL and cards referral entered.  Check labs.  No anticipated med changes.

## 2014-11-08 NOTE — Assessment & Plan Note (Signed)
New.  Pt feels this is related to her lung scarring from previous Legionaires but given her multiple risk factors- age, DM, HTN, obesity, hyperlipidemia- will need cardiology referral to r/o CAD.  Pt's EKG WNL today.  Reviewed supportive care and red flags that should prompt return.  Pt expressed understanding and is in agreement w/ plan.

## 2014-11-08 NOTE — Assessment & Plan Note (Signed)
New.  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  Will get CXR to assess.  May be a component of heat/humiditiy and deconditioning- encouraged pt to continue activity as long as it wasn't causing labored breathing or chest pain/pressure.  Refer to cardiology.  Pt expressed understanding and is in agreement w/ plan.

## 2014-11-11 ENCOUNTER — Other Ambulatory Visit: Payer: Self-pay

## 2014-11-11 DIAGNOSIS — Z1231 Encounter for screening mammogram for malignant neoplasm of breast: Secondary | ICD-10-CM

## 2014-11-12 ENCOUNTER — Encounter: Payer: Self-pay | Admitting: Family Medicine

## 2014-11-24 ENCOUNTER — Telehealth: Payer: Self-pay | Admitting: Family Medicine

## 2014-11-24 NOTE — Telephone Encounter (Signed)
Patient stated letter received regarding Dr. Tabori relocating and would like to transfer to [name].  °

## 2014-11-24 NOTE — Telephone Encounter (Signed)
Best of luck to patient! 

## 2014-12-14 ENCOUNTER — Other Ambulatory Visit: Payer: Self-pay | Admitting: Family Medicine

## 2014-12-15 NOTE — Telephone Encounter (Signed)
Medication filled to pharmacy as requested.   

## 2014-12-22 ENCOUNTER — Encounter: Payer: Self-pay | Admitting: Family Medicine

## 2014-12-22 ENCOUNTER — Ambulatory Visit (INDEPENDENT_AMBULATORY_CARE_PROVIDER_SITE_OTHER): Payer: Medicare Other | Admitting: Family Medicine

## 2014-12-22 VITALS — BP 108/68 | HR 104 | Temp 98.1°F | Wt 187.8 lb

## 2014-12-22 DIAGNOSIS — S60429A Blister (nonthermal) of unspecified finger, initial encounter: Secondary | ICD-10-CM

## 2014-12-22 MED ORDER — DOXYCYCLINE HYCLATE 100 MG PO TABS: 100.0000 mg | ORAL_TABLET | Freq: Two times a day (BID) | ORAL | Status: DC

## 2014-12-22 NOTE — Progress Notes (Signed)
Pre visit review using our clinic review tool, if applicable. No additional management support is needed unless otherwise documented below in the visit note. 

## 2014-12-22 NOTE — Patient Instructions (Signed)
Finish abx We have sent a culture on the substance that came out of the blister If it worsens or does not improve-- we will refer you to a dermatologist

## 2014-12-22 NOTE — Progress Notes (Signed)
Patient ID: [name], female    DOB: [birth year]  Age: 69 y.o. MRN: [id number]    Subjective:  Subjective HPI [name] presents for blister on R index finger x few months.  Very tender to touch.    Review of Systems  Constitutional: Negative for fever, chills and appetite change.  Skin:       Blister R index finger    History Past Medical History  Diagnosis Date  . Fibromyalgia   . Arthritis   . GERD (gastroesophageal reflux disease)   . Hyperlipidemia   . History of Legionnaire's disease 2006    was in hospital 17 days-has some scar tissue lungs  . Hypertension   . Asthma     hx scar tissue lung-only occ bronchitis  . Hard of hearing   . Chronic kidney disease   . Diabetes mellitus without complication   . Obesity   . Pityriasis rosea     She has past surgical history that includes Wrist arthroscopy (2006); Carpal tunnel release (2006); Nasal hemorrhage control (2009); Stapedectomy; Cholecystectomy; Abdominal hysterectomy; Colonoscopy; Carpal tunnel release (07/25/2011); and Appendectomy.   Her family history includes Cancer in her father; Heart disease in her mother and sister; Kidney disease in her sister.She reports that she quit smoking about 6 years ago. She does not have any smokeless tobacco history on file. She reports that she drinks alcohol. She reports that she does not use illicit drugs.  Current Outpatient Prescriptions on File Prior to Visit  Medication Sig Dispense Refill  . albuterol (PROVENTIL HFA;VENTOLIN HFA) 108 (90 BASE) MCG/ACT inhaler Inhale into the lungs every 6 (six) hours as needed for wheezing or shortness of breath.    . fluticasone (FLONASE) 50 MCG/ACT nasal spray Place 2 sprays into both nostrils daily.    Marland Kitchen gabapentin (NEURONTIN) 100 MG capsule Take 1 capsule (100 mg total) by mouth 3 (three) times daily. 90 capsule 3  . losartan-hydrochlorothiazide (HYZAAR) 100-25 MG per tablet Take 1 tablet by mouth  daily 90 tablet 1    . ranitidine (ZANTAC) 75 MG tablet Take 75 mg by mouth 2 (two) times daily.    . simvastatin (ZOCOR) 20 MG tablet Take 1 tablet by mouth at  bedtime 90 tablet 1  . vitamin C (ASCORBIC ACID) 500 MG tablet Take 500 mg by mouth daily.     No current facility-administered medications on file prior to visit.     Objective:  Objective Physical Exam  Constitutional: She appears well-developed and well-nourished.  Skin:  Blister on R index finger at base of nail Cleaned with betadine and I and D with sterile needel Gelatinous substance was expressed  Nursing note and vitals reviewed. abx ointment and bandaid put in place  BP 108/68 mmHg  Pulse 104  Temp(Src) 98.1 F (36.7 C) (Oral)  Wt 187 lb 12.8 oz (85.186 kg)  SpO2 95% Wt Readings from Last 3 Encounters:  12/22/14 187 lb 12.8 oz (85.186 kg)  11/06/14 187 lb 6.4 oz (85.004 kg)  07/08/14 185 lb 3.2 oz (84.006 kg)     Lab Results  Component Value Date   WBC 7.0 11/06/2014   HGB 12.5 11/06/2014   HCT 37.2 11/06/2014   PLT 334.0 11/06/2014   GLUCOSE 117* 11/06/2014   CHOL 164 11/06/2014   TRIG 156.0* 11/06/2014   HDL 40.50 11/06/2014   LDLDIRECT 131.0 03/13/2014   LDLCALC 92 11/06/2014   ALT 21 11/06/2014   AST 22 11/06/2014   NA 135  11/06/2014   K 3.6 11/06/2014   CL 96 11/06/2014   CREATININE 0.78 11/06/2014   BUN 10 11/06/2014   CO2 30 11/06/2014   TSH 0.84 11/06/2014   INR 1.0 12/09/2007   HGBA1C 6.5 11/06/2014    Dg Chest 2 View  11/06/2014   CLINICAL DATA:  Shortness of breath.  EXAM: CHEST  2 VIEW  COMPARISON:  None.  FINDINGS: Mediastinum and hilar structures normal. Lungs are clear. Cardiomegaly with normal pulmonary vascularity. No pleural effusion or pneumothorax. Basilar pleural parenchymal thickening noted consistent with scarring.  IMPRESSION: Basal pleural-parenchymal thickening noted consistent with scarring. No acute cardiopulmonary disease.   Electronically Signed   By: Maisie Fus  Register   On:  11/06/2014 09:42     Assessment & Plan:  Plan I have discontinued Ms. Budney's conjugated estrogens, Vitamin D (Ergocalciferol), and alendronate. I am also having her start on doxycycline. Additionally, I am having her maintain her vitamin C, ranitidine, fluticasone, albuterol, gabapentin, losartan-hydrochlorothiazide, simvastatin, and Vitamin D.  Meds ordered this encounter  Medications  . Cholecalciferol (VITAMIN D) 2000 UNITS CAPS    Sig: Take by mouth.  . doxycycline (VIBRA-TABS) 100 MG tablet    Sig: Take 1 tablet (100 mg total) by mouth 2 (two) times daily.    Dispense:  20 tablet    Refill:  0    Problem List Items Addressed This Visit    None    Visit Diagnoses    Blister of finger, initial encounter    -  Primary    Relevant Medications    doxycycline (VIBRA-TABS) 100 MG tablet    Other Relevant Orders    Wound culture       Follow-up: Return if symptoms worsen or fail to improve.  Loreen Freud, DO

## 2014-12-25 ENCOUNTER — Ambulatory Visit
Admission: RE | Admit: 2014-12-25 | Discharge: 2014-12-25 | Disposition: A | Discharge status: Home / Self Care | Payer: Medicare Other | Source: Ambulatory Visit

## 2014-12-25 DIAGNOSIS — Z1231 Encounter for screening mammogram for malignant neoplasm of breast: Secondary | ICD-10-CM | POA: Diagnosis not present

## 2014-12-25 LAB — WOUND CULTURE
Gram Stain: NONE SEEN
Gram Stain: NONE SEEN
ORGANISM ID, BACTERIA: NO GROWTH

## 2015-03-12 ENCOUNTER — Ambulatory Visit (INDEPENDENT_AMBULATORY_CARE_PROVIDER_SITE_OTHER): Payer: Medicare Other | Admitting: Medical

## 2015-03-12 ENCOUNTER — Encounter: Payer: Self-pay | Admitting: Medical

## 2015-03-12 VITALS — BP 146/92 | HR 101 | Temp 97.6°F | Ht 62.0 in | Wt 186.6 lb

## 2015-03-12 DIAGNOSIS — R21 Rash and other nonspecific skin eruption: Secondary | ICD-10-CM | POA: Diagnosis not present

## 2015-03-12 DIAGNOSIS — L853 Xerosis cutis: Secondary | ICD-10-CM

## 2015-03-12 MED ORDER — HYDROXYZINE HCL 10 MG PO TABS: 10.0000 mg | ORAL_TABLET | Freq: Three times a day (TID) | ORAL | Status: DC | PRN

## 2015-03-12 MED ORDER — AMMONIUM LACTATE 12 % EX LOTN: 1.0000 "application " | TOPICAL_LOTION | CUTANEOUS | Status: AC | PRN

## 2015-03-12 NOTE — Patient Instructions (Addendum)
Will rx lac-hydrin and low dose hydroxyzine(rx advisement given). If side effects to hydroxyzine such as excess drowsiness then stop.  Call us mid week on how rash is. If persists then derm referral.  Follow up in 7 days or as needed  May get lab work pending derm referral if rash perists or worsens as well.

## 2015-03-12 NOTE — Progress Notes (Signed)
Pre visit review using our clinic review tool, if applicable. No additional management support is needed unless otherwise documented below in the visit note. 

## 2015-03-12 NOTE — Progress Notes (Signed)
Subjective:    Patient ID: Amy PresserSandra Darlene Abbott, female    DOB: 07-Sep-1945, 69 y.o.   MRN: [id number]  HPI  Pt in with very severe rash all over her back, arms and legs. This has been going on for six weeks.  She does itch. Pt states tried eucerin, cortisone 10 and tridermis otc. But this persists  Pt states denied hx of any eczema or psoriasis.   Pt has recent rash that itches. Reports rash occurred after no known particular exposure. On review pt does not report any suspicious exposure to soaps, creams, detergents, make up, lotions, detergents, animal exposure exposure, plants or insect bites.  Pt rash is in the area diffuse scattered. Pt reports no shortness of breath or wheezing. .      Review of Systems  Constitutional: Negative for fever, chills, diaphoresis, activity change and fatigue.  Respiratory: Negative for cough, chest tightness and shortness of breath.   Cardiovascular: Negative for chest pain, palpitations and leg swelling.  Gastrointestinal: Negative for nausea, vomiting and abdominal pain.  Musculoskeletal: Negative for neck pain and neck stiffness.  Skin: Positive for rash.  Neurological: Negative for dizziness and weakness.  Psychiatric/Behavioral: Negative for behavioral problems, confusion and agitation. The patient is not nervous/anxious.    Past Medical History  Diagnosis Date  . Fibromyalgia   . Arthritis   . GERD (gastroesophageal reflux disease)   . Hyperlipidemia   . History of Legionnaire's disease 2006    was in hospital 17 days-has some scar tissue lungs  . Hypertension   . Asthma     hx scar tissue lung-only occ bronchitis  . Hard of hearing   . Chronic kidney disease   . Diabetes mellitus without complication (HCC)   . Obesity   . Pityriasis rosea     Social History   Social History  . Marital Status: Married    Spouse Name: N/A  . Number of Children: N/A  . Years of Education: N/A   Occupational History  . Not on file.    Social History Main Topics  . Smoking status: Former Smoker    Quit date: 07/19/2008  . Smokeless tobacco: Not on file  . Alcohol Use: Yes     Comment: occ  . Drug Use: No  . Sexual Activity: Not on file   Other Topics Concern  . Not on file   Social History Narrative    Past Surgical History  Procedure Laterality Date  . Wrist arthroscopy  2006    right  . Carpal tunnel release  2006    rt  . Nasal hemorrhage control  2009  . Stapedectomy      bilateral-wears hearing aids  . Cholecystectomy    . Abdominal hysterectomy    . Colonoscopy    . Carpal tunnel release  07/25/2011    Procedure: CARPAL TUNNEL RELEASE;  Surgeon: Wyn Forsterobert V Sypher Jr., MD;  Location: [facility];  Service: Orthopedics;  Laterality: Left;  . Appendectomy      Family History  Problem Relation Age of Onset  . Heart disease Mother   . Cancer Father     malignant neoplasm  . Heart disease Sister   . Kidney disease Sister     renal disease    Allergies  Allergen Reactions  . Amoxicillin Anaphylaxis  . Codeine Shortness Of Breath  . Cymbalta [Duloxetine Hcl] Hives  . Penicillins Anaphylaxis  . Sulfa Antibiotics Shortness Of Breath  . Tizanidine Shortness Of Breath  Current Outpatient Prescriptions on File Prior to Visit  Medication Sig Dispense Refill  . albuterol (PROVENTIL HFA;VENTOLIN HFA) 108 (90 BASE) MCG/ACT inhaler Inhale into the lungs every 6 (six) hours as needed for wheezing or shortness of breath.    . Cholecalciferol (VITAMIN D) 2000 UNITS CAPS Take by mouth.    . fluticasone (FLONASE) 50 MCG/ACT nasal spray Place 2 sprays into both nostrils daily.    Marland Kitchen gabapentin (NEURONTIN) 100 MG capsule Take 1 capsule (100 mg total) by mouth 3 (three) times daily. 90 capsule 3  . losartan-hydrochlorothiazide (HYZAAR) 100-25 MG per tablet Take 1 tablet by mouth  daily 90 tablet 1  . ranitidine (ZANTAC) 75 MG tablet Take 75 mg by mouth 2 (two) times daily.    . simvastatin  (ZOCOR) 20 MG tablet Take 1 tablet by mouth at  bedtime 90 tablet 1  . vitamin C (ASCORBIC ACID) 500 MG tablet Take 500 mg by mouth daily.    Marland Kitchen doxycycline (VIBRA-TABS) 100 MG tablet Take 1 tablet (100 mg total) by mouth 2 (two) times daily. 20 tablet 0   No current facility-administered medications on file prior to visit.    BP 146/92 mmHg  Pulse 101  Temp(Src) 97.6 F (36.4 C) (Oral)  Ht  (1.575 m)  Wt 186 lb 9.6 oz (84.641 kg)  BMI 34.12 kg/m2  SpO2 95%       Objective:   Physical Exam   General- No acute distress. Pleasant patient. Neck- Full range of motion, no jvd Lungs- Clear, even and unlabored. Heart- regular rate and rhythm. Neurologic- CNII- XII grossly intact. Derm- scattered rash arms leg and thorax. Pink reddish appearance with lichinified feel. No warmth.       Assessment & Plan:  Will rx lac-hydrin and low dose hydroxyzine(rx advisement given). If side effects to hydroxyzine such as excess drowsiness then stop.  Call us mid week on how rash is. If persists then derm referral.  Follow up in 7 days or as needed  May get lab work pending derm referral if rash perists or worsens as well.

## 2015-04-08 ENCOUNTER — Encounter: Payer: Medicare Other | Admitting: Family Medicine

## 2015-04-16 DIAGNOSIS — L300 Nummular dermatitis: Secondary | ICD-10-CM | POA: Diagnosis not present

## 2015-04-21 ENCOUNTER — Other Ambulatory Visit: Payer: Self-pay | Admitting: Medical

## 2015-04-23 ENCOUNTER — Encounter: Payer: Self-pay | Admitting: Medical

## 2015-04-23 ENCOUNTER — Encounter: Payer: Self-pay | Admitting: Family Medicine

## 2015-05-03 ENCOUNTER — Other Ambulatory Visit: Payer: Self-pay | Admitting: Family Medicine

## 2015-05-03 NOTE — Telephone Encounter (Signed)
Medication filled to pharmacy as requested.   

## 2015-05-03 NOTE — Telephone Encounter (Signed)
She has not Est with Dr.Lowne, labs were done last with Dr.Tabori.    KP

## 2015-06-15 ENCOUNTER — Telehealth: Payer: Self-pay | Admitting: Family Medicine

## 2015-06-15 NOTE — Telephone Encounter (Signed)
Updated.      KP 

## 2015-06-15 NOTE — Telephone Encounter (Signed)
Patient declined flu shot  °

## 2015-07-23 ENCOUNTER — Encounter: Payer: Medicare Other | Admitting: Family Medicine

## 2015-07-23 ENCOUNTER — Other Ambulatory Visit: Payer: Self-pay | Admitting: Medical

## 2015-07-27 NOTE — Telephone Encounter (Signed)
Sent pt rx of neurontin

## 2015-10-06 ENCOUNTER — Other Ambulatory Visit: Payer: Self-pay | Admitting: Medical

## 2015-10-07 NOTE — Telephone Encounter (Signed)
Refilled pt gabapentin.

## 2015-11-09 ENCOUNTER — Telehealth: Payer: Self-pay

## 2015-11-09 NOTE — Telephone Encounter (Signed)
Patient called regarding billing had coders review and bill has been resubmitted with supervising provider as billing provider. Told patient to disregard the previous bill

## 2015-11-26 ENCOUNTER — Ambulatory Visit (INDEPENDENT_AMBULATORY_CARE_PROVIDER_SITE_OTHER): Payer: Medicare Other | Admitting: Family Medicine

## 2015-11-26 ENCOUNTER — Ambulatory Visit: Payer: Medicare Other | Admitting: Family Medicine

## 2015-11-26 ENCOUNTER — Encounter: Payer: Self-pay | Admitting: Family Medicine

## 2015-11-26 VITALS — BP 102/62 | HR 88 | Temp 97.7°F | Resp 16 | Ht 62.0 in | Wt 184.5 lb

## 2015-11-26 DIAGNOSIS — J0100 Acute maxillary sinusitis, unspecified: Secondary | ICD-10-CM

## 2015-11-26 MED ORDER — DOXYCYCLINE HYCLATE 100 MG PO TABS: 100.0000 mg | ORAL_TABLET | Freq: Two times a day (BID) | ORAL | 0 refills | Status: AC

## 2015-11-26 MED ORDER — DOXYCYCLINE HYCLATE 100 MG PO TABS: 100.0000 mg | ORAL_TABLET | Freq: Two times a day (BID) | ORAL | 0 refills | Status: DC

## 2015-11-26 NOTE — Progress Notes (Signed)
SUBJECTIVE:   [name] is a 70 y.o. female presents to the clinic for:  Chief Complaint  Patient presents with  . Sinus Problem    patient c/o left sided pressure under eye for a little over a week, also burning in throat for about 2 days.    Complains of L sided sinus pain for 1 weeks.  Other associated symptoms: sinus headache, sinus congestion, sinus pain and post nasal drip.  Denies: subjective fever, itchy watery eyes, ear pain, ear drainage, sore throat, shortness of breath and cough Sick Contacts: none known Therapy to date: nasal steroids and Tylenol  Past Medical History:  Diagnosis Date  . Arthritis   . Asthma    hx scar tissue lung-only occ bronchitis  . Chronic kidney disease   . Diabetes mellitus without complication (HCC)   . Fibromyalgia   . GERD (gastroesophageal reflux disease)   . Hard of hearing   . History of Legionnaire's disease 2006   was in hospital 17 days-has some scar tissue lungs  . Hyperlipidemia   . Hypertension   . Obesity   . Pityriasis rosea    History  Smoking Status  . Former Smoker  . Quit date: 07/19/2008   ROS:  HEENT: +Sinus pain, no ear pain  Patient's medications, allergies, past medical, surgical, social and family histories were reviewed and updated as appropriate.   OBJECTIVE:  BP 102/62 (BP Location: Right Arm, Patient Position: Sitting, Cuff Size: Normal)   Pulse 88   Temp 97.7 F (36.5 C) (Oral)   Resp 16   Ht 5\' 2"  (1.575 m)   Wt 184 lb 8 oz (83.7 kg)   SpO2 97%   BMI 33.75 kg/m  General: Awake, alert, appearing stated age Eyes: conjunctivae and sclerae clear Ears: normal TMs bilaterally, canals neg Nose: no visible exudate, leftward septal deviation; +TTP over L frontal sinus and very TTP over L maxillary sinus Oropharynx: lips, mucosa, and tongue normal; teeth and gums normal Neck: supple, no significant adenopathy Lungs: clear to auscultation, no wheezes, rales or rhonchi, symmetric air entry,  normal effort Heart: rate and rhythm regular Skin:reveals no rash Psych: Age appropriate judgment and insight  ASSESSMENT/PLAN:  Acute maxillary sinusitis, recurrence not specified - Plan: DISCONTINUED: doxycycline (VIBRA-TABS) 100 MG tablet  Orders as above. Instructed to wait until Monday (10 days from start of symptoms) before taking abx. Continue Flonase, Tylenol for pain. Could do 1 week of Advil for pain as well. F/u prn. Pt voiced understanding and agreement to the plan.  Jilda RocheNicholas Paul [place]

## 2015-11-26 NOTE — Progress Notes (Signed)
Pre visit review using our clinic review tool, if applicable. No additional management support is needed unless otherwise documented below in the visit note. 

## 2015-11-26 NOTE — Patient Instructions (Addendum)
Wait until Monday before taking abx. If your symptoms continue to worsen, then take the antibiotic. Continue taking Flonase daily. You could try saline rinses if it is helpful. You could also take Advil twice daily for the next week for pain relief.

## 2015-12-01 ENCOUNTER — Encounter: Payer: Self-pay | Admitting: Family Medicine

## 2015-12-10 ENCOUNTER — Other Ambulatory Visit: Payer: Self-pay | Admitting: Family Medicine

## 2015-12-10 DIAGNOSIS — Z1231 Encounter for screening mammogram for malignant neoplasm of breast: Secondary | ICD-10-CM

## 2015-12-13 ENCOUNTER — Encounter: Payer: Self-pay | Admitting: Family Medicine

## 2015-12-13 ENCOUNTER — Ambulatory Visit (HOSPITAL_BASED_OUTPATIENT_CLINIC_OR_DEPARTMENT_OTHER)
Admission: RE | Admit: 2015-12-13 | Discharge: 2015-12-13 | Disposition: A | Discharge status: Home / Self Care | Payer: Medicare Other | Source: Ambulatory Visit | Attending: Family Medicine | Admitting: Family Medicine

## 2015-12-13 ENCOUNTER — Ambulatory Visit (INDEPENDENT_AMBULATORY_CARE_PROVIDER_SITE_OTHER): Payer: Medicare Other | Admitting: Family Medicine

## 2015-12-13 VITALS — BP 110/76 | HR 92 | Temp 97.7°F | Ht 62.0 in | Wt 184.2 lb

## 2015-12-13 DIAGNOSIS — J302 Other seasonal allergic rhinitis: Secondary | ICD-10-CM | POA: Diagnosis not present

## 2015-12-13 DIAGNOSIS — J329 Chronic sinusitis, unspecified: Secondary | ICD-10-CM | POA: Diagnosis not present

## 2015-12-13 MED ORDER — MONTELUKAST SODIUM 10 MG PO TABS
10.0000 mg | ORAL_TABLET | Freq: Every day | ORAL | 1 refills | Status: DC
Start: 2015-12-13 — End: 2015-12-24

## 2015-12-13 NOTE — Patient Instructions (Signed)
Zyrtec (cetirizine) Generic, and therefore cheaper, options are in the parentheses.   Xyzal (levcetirizine) is another equivalent to Zyrtec, but may be more expensive.

## 2015-12-13 NOTE — Progress Notes (Signed)
Pre visit review using our clinic review tool, if applicable. No additional management support is needed unless otherwise documented below in the visit note. 

## 2015-12-13 NOTE — Progress Notes (Signed)
Chief Complaint  Patient presents with  . Follow-up    sinusitis-pt states still problems    [name] here for URI complaints.  Duration: No improvement from last visit; symptoms going on since 8/18 Associated symptoms: sinus congestion, dental pain and sinus pain Denies: Fever, sore throat, SOB, or cough Treatment to date: OTC antihistamines, steroid nasal spray daily, Doxy Sick contacts: No  She does have a hx of allergies that affect her worse from spring through the fall. Over the past 5-6 years, she has had around 5 sinus infections yearly. She has never had any imaging. Sometimes abx improve things. She does not have pus dripping from her nose. The L side is normally affected. No previous surgeries.  ROS:  Const: Denies fevers HEENT: As noted in HPI Lungs: No SOB  Past Medical History:  Diagnosis Date  . Arthritis   . Asthma    hx scar tissue lung-only occ bronchitis  . Chronic kidney disease   . Diabetes mellitus without complication (HCC)   . Fibromyalgia   . GERD (gastroesophageal reflux disease)   . Hard of hearing   . History of Legionnaire's disease 2006   was in hospital 17 days-has some scar tissue lungs  . Hyperlipidemia   . Hypertension   . Obesity   . Pityriasis rosea    Family History  Problem Relation Age of Onset  . Heart disease Mother   . Cancer Father     malignant neoplasm  . Heart disease Sister   . Kidney disease Sister     renal disease    BP 110/76 (BP Location: Left Arm, Patient Position: Sitting, Cuff Size: Normal)   Pulse 92   Temp 97.7 F (36.5 C) (Oral)   Ht 5\' 2"  (1.575 m)   Wt 184 lb 3.2 oz (83.6 kg)   SpO2 96%   BMI 33.69 kg/m  General: Awake, alert, appears stated age HEENT: AT, [place], ears patent b/l and TM's neg, nares patent w/o discharge, +L max sinus TTP, pharynx pink and without exudates, MMM Neck: No masses or asymmetry Heart: RRR, no murmurs, no bruits Lungs: CTAB, no accessory muscle use Psych: Age  appropriate judgment and insight, normal mood and affect  Chronic recurrent sinusitis - Plan: CT Maxillofacial WO CM, Ambulatory referral to ENT  Seasonal allergies - Plan: montelukast (SINGULAIR) 10 MG tablet  Orders as above. Will max out on allergy therapy with Flonase, cetirizine, and Singulair. F/u in 2 mo if symptoms worsen or fail to improve. Will refer to allergy at that point if it is not an ENT issue. Pt voiced understanding and agreement to the plan.  Jilda Rocheicholas Paul [place], DO 12/13/15 7:35 AM

## 2015-12-24 ENCOUNTER — Other Ambulatory Visit: Payer: Self-pay

## 2015-12-24 ENCOUNTER — Ambulatory Visit (INDEPENDENT_AMBULATORY_CARE_PROVIDER_SITE_OTHER): Payer: Medicare Other | Admitting: Family Medicine

## 2015-12-24 VITALS — BP 108/60 | HR 63 | Temp 98.3°F | Wt 180.6 lb

## 2015-12-24 DIAGNOSIS — R7989 Other specified abnormal findings of blood chemistry: Secondary | ICD-10-CM | POA: Diagnosis not present

## 2015-12-24 DIAGNOSIS — N390 Urinary tract infection, site not specified: Secondary | ICD-10-CM | POA: Diagnosis not present

## 2015-12-24 DIAGNOSIS — I10 Essential (primary) hypertension: Secondary | ICD-10-CM

## 2015-12-24 DIAGNOSIS — E1169 Type 2 diabetes mellitus with other specified complication: Secondary | ICD-10-CM

## 2015-12-24 DIAGNOSIS — M797 Fibromyalgia: Secondary | ICD-10-CM

## 2015-12-24 DIAGNOSIS — R21 Rash and other nonspecific skin eruption: Secondary | ICD-10-CM

## 2015-12-24 DIAGNOSIS — R82998 Other abnormal findings in urine: Secondary | ICD-10-CM

## 2015-12-24 DIAGNOSIS — E118 Type 2 diabetes mellitus with unspecified complications: Secondary | ICD-10-CM

## 2015-12-24 DIAGNOSIS — E1140 Type 2 diabetes mellitus with diabetic neuropathy, unspecified: Secondary | ICD-10-CM | POA: Diagnosis not present

## 2015-12-24 DIAGNOSIS — E785 Hyperlipidemia, unspecified: Secondary | ICD-10-CM | POA: Diagnosis not present

## 2015-12-24 LAB — LIPID PANEL
CHOL/HDL RATIO: 4
Cholesterol: 162 mg/dL (ref 0–200)
HDL: 44.6 mg/dL (ref >39.00)
NONHDL: 117.54
TRIGLYCERIDES: 206 mg/dL — AB (ref 0.0–149.0)
VLDL: 41.2 mg/dL — ABNORMAL HIGH (ref 0.0–40.0)

## 2015-12-24 LAB — CBC WITH DIFFERENTIAL/PLATELET
BASOS ABS: 0 10*3/uL (ref 0.0–0.1)
BASOS PCT: 0.6 % (ref 0.0–3.0)
Eosinophils Absolute: 0.1 10*3/uL (ref 0.0–0.7)
Eosinophils Relative: 2.5 % (ref 0.0–5.0)
HCT: 37.1 % (ref 36.0–46.0)
Hemoglobin: 12.7 g/dL (ref 12.0–15.0)
LYMPHS ABS: 1.8 10*3/uL (ref 0.7–4.0)
Lymphocytes Relative: 29.9 % (ref 12.0–46.0)
MCHC: 34.3 g/dL (ref 30.0–36.0)
MCV: 87.2 fl (ref 78.0–100.0)
MONOS PCT: 6.3 % (ref 3.0–12.0)
Monocytes Absolute: 0.4 10*3/uL (ref 0.1–1.0)
NEUTROS ABS: 3.6 10*3/uL (ref 1.4–7.7)
NEUTROS PCT: 60.7 % (ref 43.0–77.0)
PLATELETS: 320 10*3/uL (ref 150.0–400.0)
RBC: 4.26 Mil/uL (ref 3.87–5.11)
RDW: 13 % (ref 11.5–15.5)
WBC: 6 10*3/uL (ref 4.0–10.5)

## 2015-12-24 LAB — LDL CHOLESTEROL, DIRECT: LDL DIRECT: 102 mg/dL

## 2015-12-24 LAB — POCT URINALYSIS DIPSTICK
BILIRUBIN UA: NEGATIVE
Glucose, UA: NEGATIVE
KETONES UA: NEGATIVE
Nitrite, UA: NEGATIVE
PH UA: 6
Protein, UA: NEGATIVE
RBC UA: NEGATIVE
Spec Grav, UA: >=1.03
Urobilinogen, UA: 0.2

## 2015-12-24 LAB — COMPREHENSIVE METABOLIC PANEL
ALT: 20 U/L (ref 0–35)
AST: 19 U/L (ref 0–37)
Albumin: 4.1 g/dL (ref 3.5–5.2)
Alkaline Phosphatase: 48 U/L (ref 39–117)
BUN: 13 mg/dL (ref 6–23)
CALCIUM: 9.7 mg/dL (ref 8.4–10.5)
CHLORIDE: 99 meq/L (ref 96–112)
CO2: 32 meq/L (ref 19–32)
CREATININE: 0.82 mg/dL (ref 0.40–1.20)
GFR: 73.66 mL/min (ref >60.00)
Glucose, Bld: 100 mg/dL — ABNORMAL HIGH (ref 70–99)
POTASSIUM: 3.9 meq/L (ref 3.5–5.1)
Sodium: 137 mEq/L (ref 135–145)
Total Bilirubin: 0.4 mg/dL (ref 0.2–1.2)
Total Protein: 7 g/dL (ref 6.0–8.3)

## 2015-12-24 LAB — HEMOGLOBIN A1C: Hgb A1c MFr Bld: 6.6 % — ABNORMAL HIGH (ref 4.6–6.5)

## 2015-12-24 LAB — VITAMIN D 25 HYDROXY (VIT D DEFICIENCY, FRACTURES): VITD: 25.35 ng/mL — ABNORMAL LOW (ref 30.00–100.00)

## 2015-12-24 MED ORDER — GABAPENTIN 300 MG PO CAPS: 300.0000 mg | ORAL_CAPSULE | Freq: Three times a day (TID) | ORAL | 3 refills | Status: DC

## 2015-12-24 NOTE — Progress Notes (Unsigned)
Pre visit review using our clinic review tool, if applicable. No additional management support is needed unless otherwise documented below in the visit note. 

## 2015-12-24 NOTE — Progress Notes (Signed)
Patient ID: Amy PresserSandra Darlene Abbott, female    DOB: [birth year]  Age: 70 y.o. MRN: [id number]    Subjective:  Subjective  HPI [name] presents for f/u dm, cholesterol and htn.   She is c/o problems with the fibromyalgia--- the pain is worse and she is wondering if the gabapentin can be changed.  No other complaints.   HYPERTENSION   Blood pressure range-not checking   Chest pain- no      Dyspnea- no Lightheadedness- no   Edema- no  Other side effects - no   Medication compliance: good Low salt diet- yes    DIABETES    Blood Sugar ranges-not checking  Polyuria- no New Visual problems- no  Hypoglycemic symptoms- no  Other side effects-no Medication compliance - na Last eye exam- due Foot exam- today   HYPERLIPIDEMIA  Medication compliance- good RUQ pain- no  Muscle aches- no Other side effects-no   ROS See HPI above   PMH Smoking Status noted     Review of Systems  Constitutional: Positive for fatigue. Negative for activity change, appetite change and unexpected weight change.  Respiratory: Negative for cough and shortness of breath.   Cardiovascular: Negative for chest pain and palpitations.  Psychiatric/Behavioral: Negative for behavioral problems and dysphoric mood. The patient is not nervous/anxious.     History Past Medical History:  Diagnosis Date  . Arthritis   . Asthma    hx scar tissue lung-only occ bronchitis  . Chronic kidney disease   . Diabetes mellitus without complication (HCC)   . Fibromyalgia   . GERD (gastroesophageal reflux disease)   . Hard of hearing   . History of Legionnaire's disease 2006   was in hospital 17 days-has some scar tissue lungs  . Hyperlipidemia   . Hypertension   . Obesity   . Pityriasis rosea     She has a past surgical history that includes Wrist arthroscopy (2006); Carpal tunnel release (2006); Nasal hemorrhage control (2009); Stapedectomy; Cholecystectomy; Abdominal hysterectomy; Colonoscopy; Carpal  tunnel release (07/25/2011); and Appendectomy.   Her family history includes Cancer in her father; Heart disease in her mother and sister; Kidney disease in her sister.She reports that she quit smoking about 7 years ago. She does not have any smokeless tobacco history on file. She reports that she drinks alcohol. She reports that she does not use drugs.  Current Outpatient Prescriptions on File Prior to Visit  Medication Sig Dispense Refill  . ammonium lactate (LAC-HYDRIN) 12 % lotion Apply 1 application topically as needed for dry skin. 222 g 1  . Cholecalciferol (VITAMIN D) 2000 UNITS CAPS Take by mouth.    . fluticasone (FLONASE) 50 MCG/ACT nasal spray Place 2 sprays into both nostrils daily.    Marland Kitchen. triamcinolone ointment (KENALOG) 0.1 % Use as directed.    . vitamin C (ASCORBIC ACID) 500 MG tablet Take 500 mg by mouth daily.     No current facility-administered medications on file prior to visit.      Objective:  Objective  Physical Exam  Constitutional: She is oriented to person, place, and time. She appears well-developed and well-nourished.  HENT:  Head: Normocephalic and atraumatic.  Eyes: Conjunctivae and EOM are normal.  Neck: Normal range of motion. Neck supple. No JVD present. Carotid bruit is not present. No thyromegaly present.  Cardiovascular: Normal rate, regular rhythm and normal heart sounds.   No murmur heard. Pulmonary/Chest: Effort normal and breath sounds normal. No respiratory distress. She has no wheezes. She has  no rales. She exhibits no tenderness.  Musculoskeletal: She exhibits no edema.  Neurological: She is alert and oriented to person, place, and time.  Psychiatric: She has a normal mood and affect. Her behavior is normal. Judgment and thought content normal.  Nursing note and vitals reviewed.  BP 108/60 (BP Location: Left Arm, Patient Position: Sitting, Cuff Size: Normal)   Pulse 63   Temp 98.3 F (36.8 C) (Oral)   Wt 180 lb 9.6 oz (81.9 kg)   SpO2 98%    BMI 33.03 kg/m  Wt Readings from Last 3 Encounters:  12/24/15 180 lb 9.6 oz (81.9 kg)  12/13/15 184 lb 3.2 oz (83.6 kg)  11/26/15 184 lb 8 oz (83.7 kg)     Lab Results  Component Value Date   WBC 6.0 12/24/2015   HGB 12.7 12/24/2015   HCT 37.1 12/24/2015   PLT 320.0 12/24/2015   GLUCOSE 100 (H) 12/24/2015   CHOL 162 12/24/2015   TRIG 206.0 (H) 12/24/2015   HDL 44.60 12/24/2015   LDLDIRECT 102.0 12/24/2015   LDLCALC 92 11/06/2014   ALT 20 12/24/2015   AST 19 12/24/2015   NA 137 12/24/2015   K 3.9 12/24/2015   CL 99 12/24/2015   CREATININE 0.82 12/24/2015   BUN 13 12/24/2015   CO2 32 12/24/2015   TSH 0.84 11/06/2014   INR 1.0 12/09/2007   HGBA1C 6.6 (H) 12/24/2015    Ct Maxillofacial Wo Cm  Result Date: 12/14/2015 CLINICAL DATA:  Chronic recurrent sinusitis EXAM: CT MAXILLOFACIAL WITHOUT CONTRAST TECHNIQUE: Multidetector CT imaging of the maxillofacial structures was performed. Multiplanar CT image reconstructions were also generated. A small vitamin-E capsule was placed on the right temple in order to reliably differentiate right from left. COMPARISON:  12/06/2004 FINDINGS: Osseous: Normal appearance Orbits: Normal appearance Sinuses: Paranasal sinuses, mastoid air cells and middle ear cavities clear. No fluid or mucosal thickening. Soft tissues: Normal appearance Limited intracranial: Normal appearance IMPRESSION: Normal exam. Electronically Signed   By: Ulyses Southward M.D.   On: 12/14/2015 08:45     Assessment & Plan:  Plan  I have discontinued [name]'s gabapentin. I have also changed her simvastatin and losartan-hydrochlorothiazide. Additionally, I am having her start on gabapentin. Lastly, I am having her maintain her vitamin C, fluticasone, Vitamin D, ammonium lactate, triamcinolone ointment, and hydrOXYzine.  Meds ordered this encounter  Medications  . gabapentin (NEURONTIN) 300 MG capsule    Sig: Take 1 capsule (300 mg total) by mouth 3 (three) times daily.     Dispense:  90 capsule    Refill:  3  . hydrOXYzine (ATARAX/VISTARIL) 25 MG tablet    Sig: Take 1 tablet (25 mg total) by mouth 3 (three) times daily as needed.    Dispense:  30 tablet    Refill:  0  . simvastatin (ZOCOR) 20 MG tablet    Sig: Take 1 tablet (20 mg total) by mouth at bedtime.    Dispense:  90 tablet    Refill:  1  . losartan-hydrochlorothiazide (HYZAAR) 100-25 MG tablet    Sig: Take 1 tablet by mouth daily.    Dispense:  90 tablet    Refill:  1    Problem List Items Addressed This Visit      Unprioritized   DM type 2 (diabetes mellitus, type 2) (HCC)    Diet controlled stable      Relevant Medications   simvastatin (ZOCOR) 20 MG tablet   losartan-hydrochlorothiazide (HYZAAR) 100-25 MG tablet   Essential  hypertension    Stable con't losartan hct Check labs      Relevant Medications   simvastatin (ZOCOR) 20 MG tablet   losartan-hydrochlorothiazide (HYZAAR) 100-25 MG tablet   Other Relevant Orders   Lipid panel (Completed)   POCT urinalysis dipstick (Completed)   CBC with Differential/Platelet (Completed)   Hemoglobin A1c (Completed)   Comprehensive metabolic panel (Completed)   Fibromyalgia - Primary    Increase neurontin 300mg  tid        Relevant Medications   gabapentin (NEURONTIN) 300 MG capsule   Hyperlipidemia associated with type 2 diabetes mellitus (HCC)    con't zocor Check labs      Relevant Medications   simvastatin (ZOCOR) 20 MG tablet   losartan-hydrochlorothiazide (HYZAAR) 100-25 MG tablet    Other Visit Diagnoses    Controlled type 2 diabetes mellitus with diabetic neuropathy, without long-term current use of insulin (HCC)       Relevant Medications   simvastatin (ZOCOR) 20 MG tablet   losartan-hydrochlorothiazide (HYZAAR) 100-25 MG tablet   Other Relevant Orders   Lipid panel (Completed)   POCT urinalysis dipstick (Completed)   CBC with Differential/Platelet (Completed)   Hemoglobin A1c (Completed)   Comprehensive  metabolic panel (Completed)   Hyperlipidemia LDL goal <70       Relevant Medications   simvastatin (ZOCOR) 20 MG tablet   losartan-hydrochlorothiazide (HYZAAR) 100-25 MG tablet   Other Relevant Orders   Lipid panel (Completed)   Comprehensive metabolic panel (Completed)   Low serum vitamin D       Relevant Orders   Vitamin D (25 hydroxy) (Completed)   Rash and nonspecific skin eruption       Relevant Medications   hydrOXYzine (ATARAX/VISTARIL) 25 MG tablet   Leukocytes in urine       Relevant Orders   Urine culture      Follow-up: Return in about 6 months (around 06/22/2016) for annual exam, fasting.  Donato Schultz, DO

## 2015-12-24 NOTE — Patient Instructions (Signed)

## 2015-12-24 NOTE — Progress Notes (Signed)
Pre visit review using our clinic review tool, if applicable. No additional management support is needed unless otherwise documented below in the visit note. 

## 2015-12-25 ENCOUNTER — Encounter: Payer: Self-pay | Admitting: Family Medicine

## 2015-12-25 MED ORDER — SIMVASTATIN 20 MG PO TABS: 20.0000 mg | ORAL_TABLET | Freq: Every day | ORAL | 1 refills | Status: DC

## 2015-12-25 MED ORDER — LOSARTAN POTASSIUM-HCTZ 100-25 MG PO TABS: 1.0000 | ORAL_TABLET | Freq: Every day | ORAL | 1 refills | Status: DC

## 2015-12-25 NOTE — Assessment & Plan Note (Addendum)
con't zocor Check labs 

## 2015-12-25 NOTE — Assessment & Plan Note (Signed)
Diet controlled stable

## 2015-12-25 NOTE — Assessment & Plan Note (Addendum)
Increase neurontin 300mg  tid

## 2015-12-25 NOTE — Assessment & Plan Note (Signed)
Stable con't losartan hct Check labs

## 2015-12-26 LAB — URINE CULTURE

## 2015-12-27 ENCOUNTER — Other Ambulatory Visit: Payer: Self-pay

## 2015-12-27 MED ORDER — VITAMIN D (ERGOCALCIFEROL) 1.25 MG (50000 UNIT) PO CAPS: 50000.0000 [IU] | ORAL_CAPSULE | ORAL | 2 refills | Status: AC

## 2016-01-07 ENCOUNTER — Ambulatory Visit
Admission: RE | Admit: 2016-01-07 | Discharge: 2016-01-07 | Disposition: A | Discharge status: Home / Self Care | Payer: Medicare Other | Source: Ambulatory Visit | Attending: Family Medicine | Admitting: Family Medicine

## 2016-01-07 DIAGNOSIS — Z1231 Encounter for screening mammogram for malignant neoplasm of breast: Secondary | ICD-10-CM

## 2016-02-07 ENCOUNTER — Other Ambulatory Visit: Payer: Self-pay | Admitting: Family Medicine

## 2016-02-07 DIAGNOSIS — I10 Essential (primary) hypertension: Secondary | ICD-10-CM

## 2016-02-07 DIAGNOSIS — E785 Hyperlipidemia, unspecified: Secondary | ICD-10-CM

## 2016-02-11 ENCOUNTER — Other Ambulatory Visit: Payer: Self-pay | Admitting: Family Medicine

## 2016-02-11 DIAGNOSIS — M797 Fibromyalgia: Secondary | ICD-10-CM

## 2016-03-16 ENCOUNTER — Telehealth: Payer: Self-pay | Admitting: Family Medicine

## 2016-03-16 NOTE — Telephone Encounter (Signed)
Patient establishing with another provider, denied making medicare wellness appointment.

## 2016-03-17 ENCOUNTER — Encounter: Payer: Self-pay | Admitting: Family Medicine

## 2016-08-28 IMAGING — DX DG CHEST 2V
2 series · 2 of 2 positions shown · non-contrast
Comparison: None.

CLINICAL DATA: Shortness of breath.

EXAM:
CHEST  2 VIEW

[chest pa]
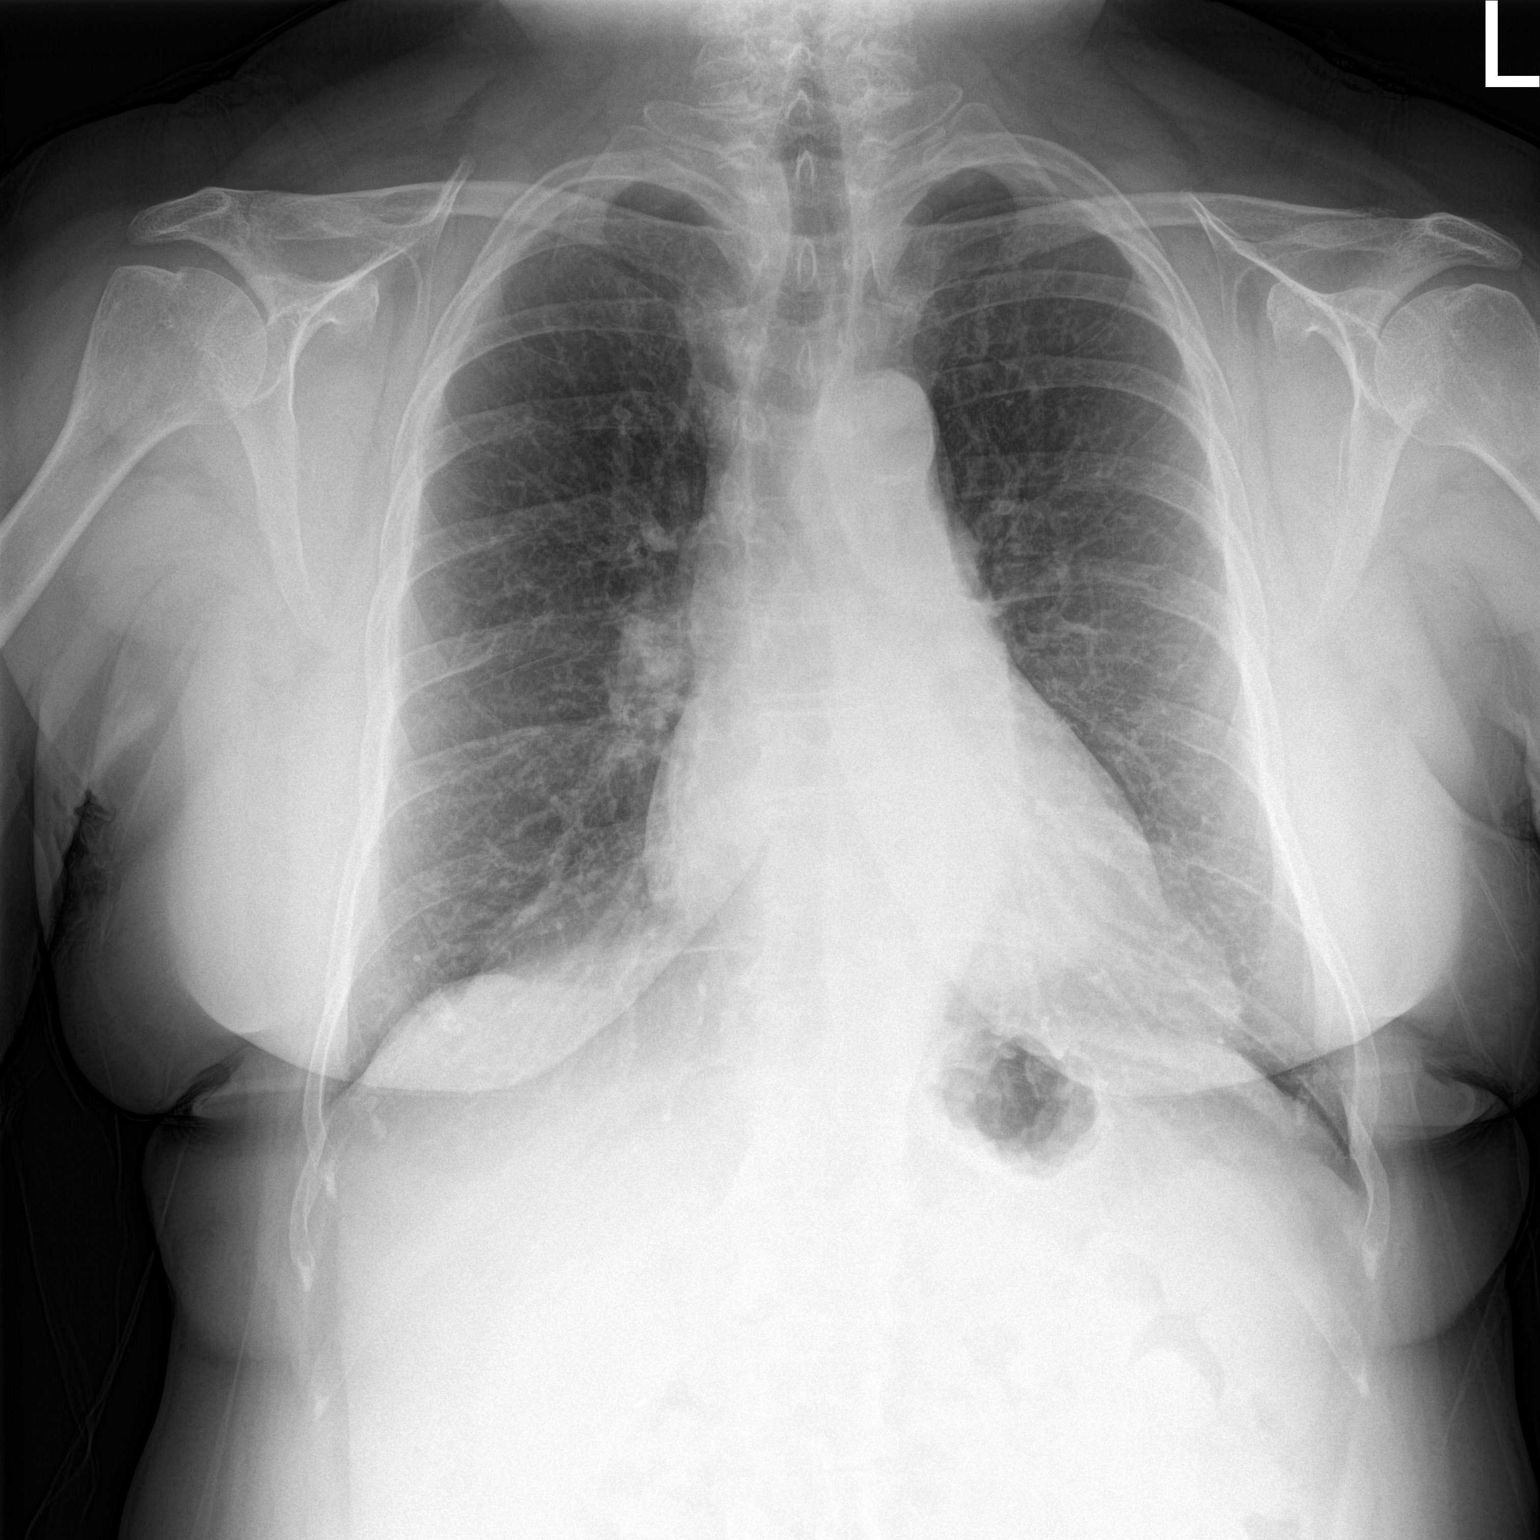

[chest lat]
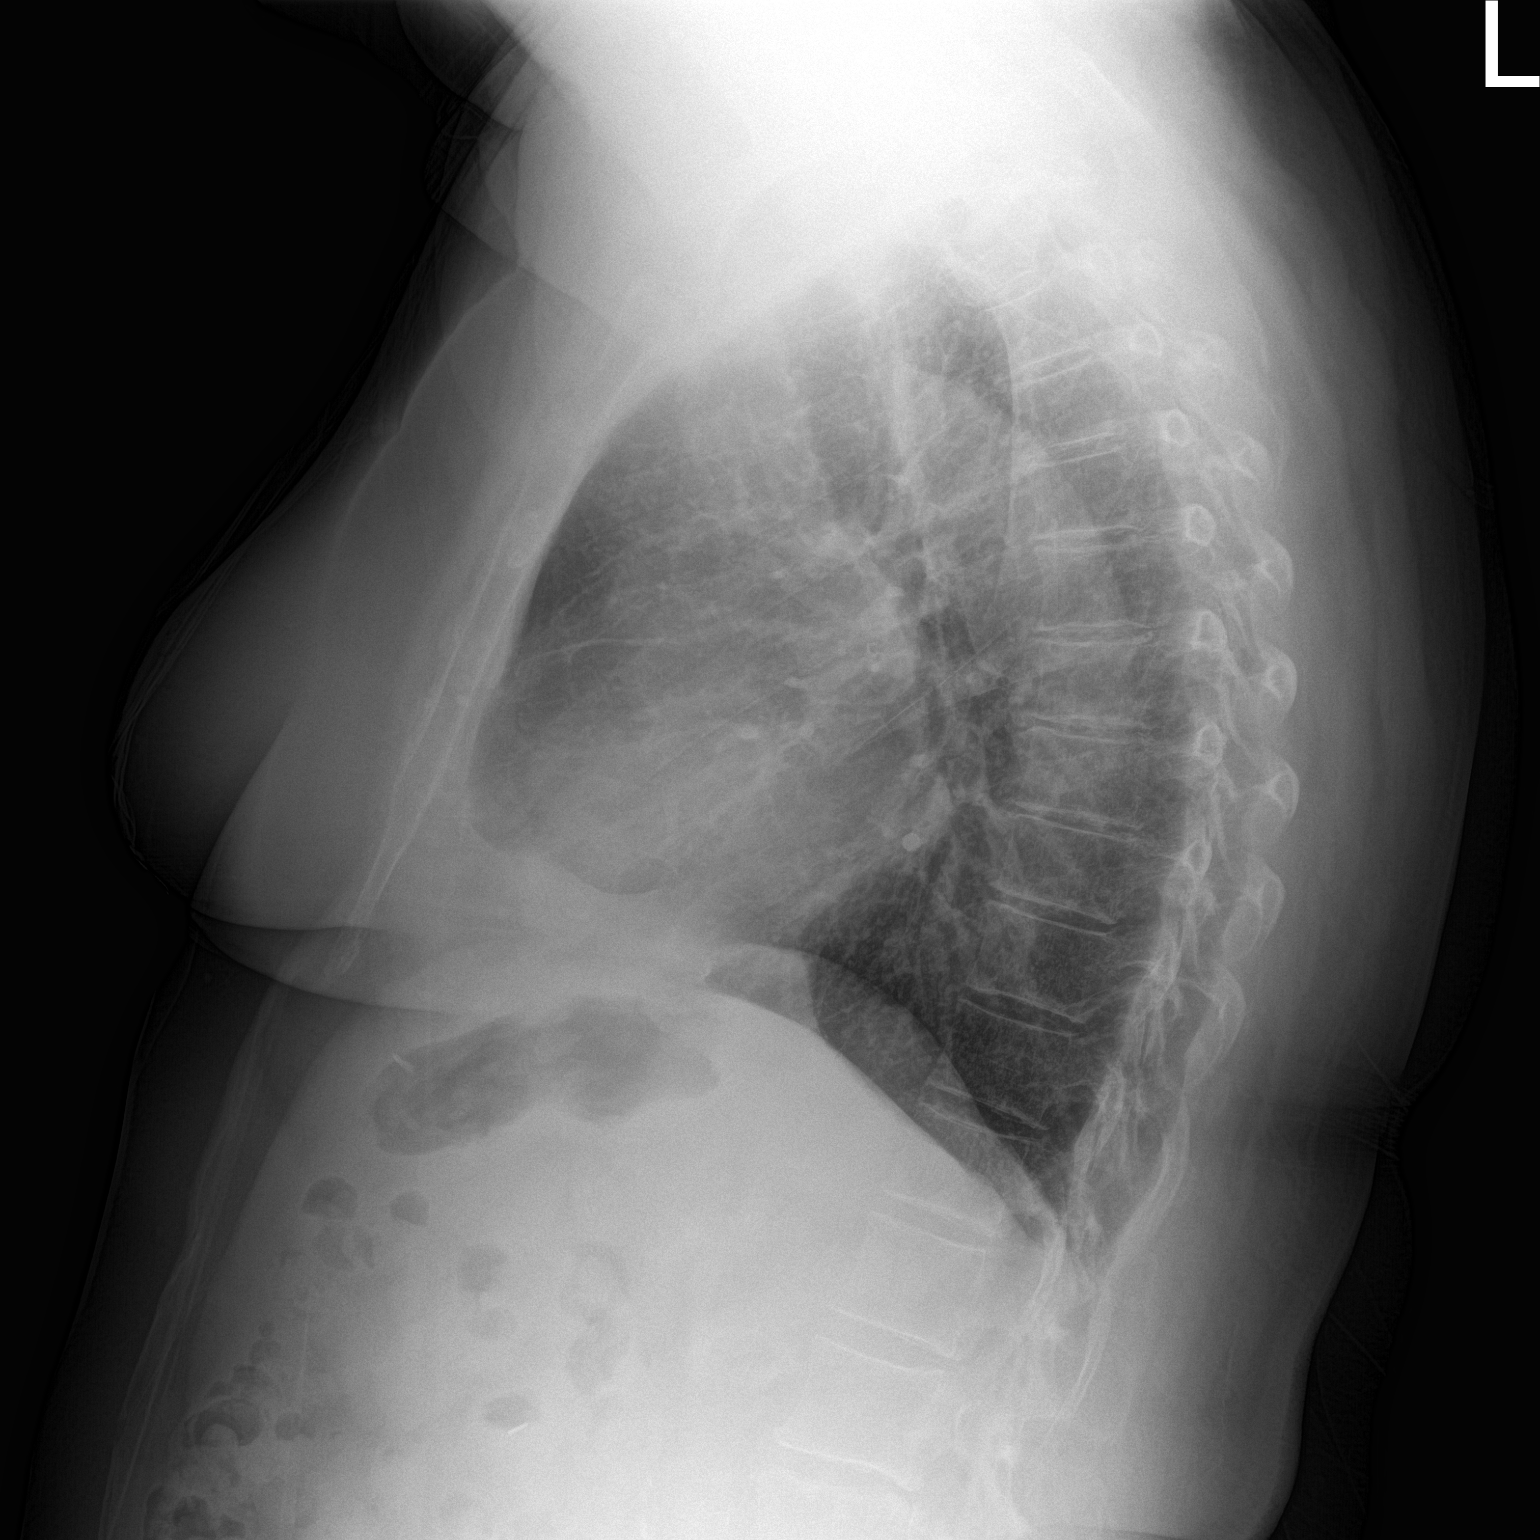

[2 of 2 positions shown; findings below may reference images not displayed]

FINDINGS: Mediastinum and hilar structures normal. Lungs are clear.
Cardiomegaly with normal pulmonary vascularity. No pleural effusion
or pneumothorax. Basilar pleural parenchymal thickening noted
consistent with scarring.
IMPRESSION: Basal pleural-parenchymal thickening noted consistent with scarring.
No acute cardiopulmonary disease.

## 2016-10-16 IMAGING — MG MM SCREENING BREAST TOMO BILATERAL
8 series · 9 of 24 positions shown · non-contrast
Comparison: Previous exam(s).

ACR Breast Density Category a: The breast tissue is almost entirely
fatty.

CLINICAL DATA: Screening.

EXAM:
DIGITAL SCREENING BILATERAL MAMMOGRAM WITH 3D TOMO WITH CAD

[R CC]
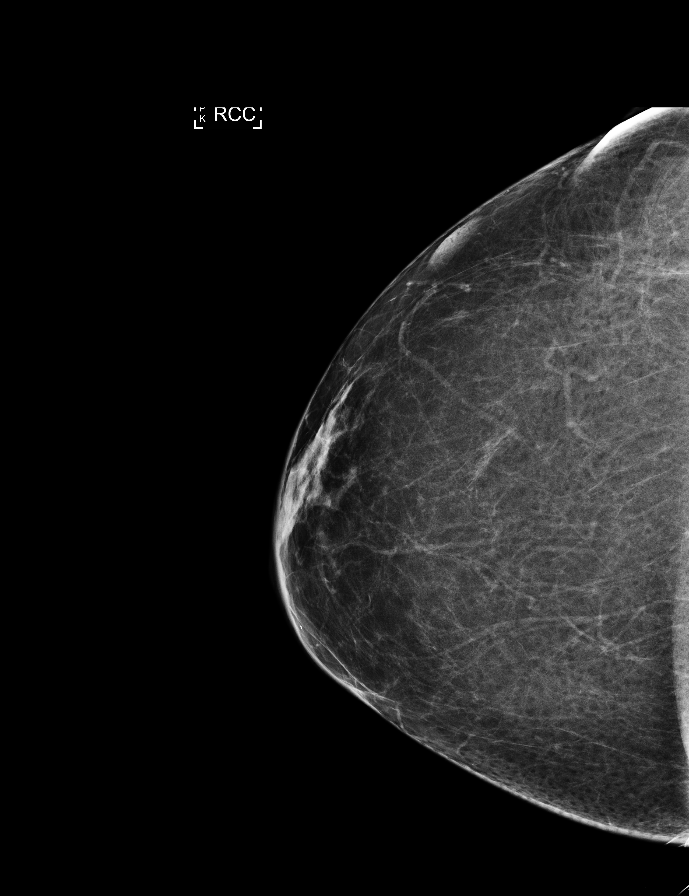

[R MLO]
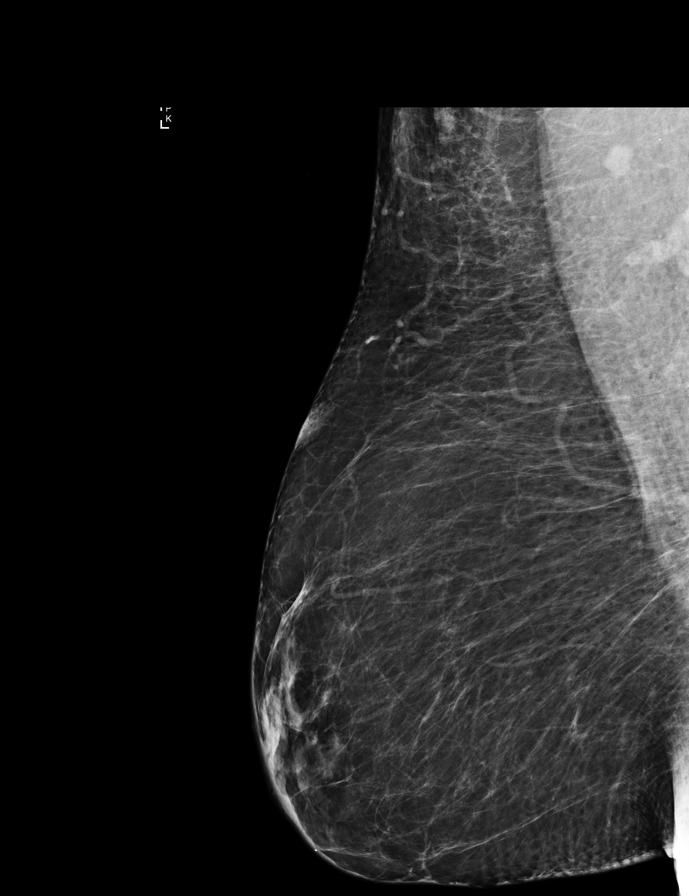

[L CC]
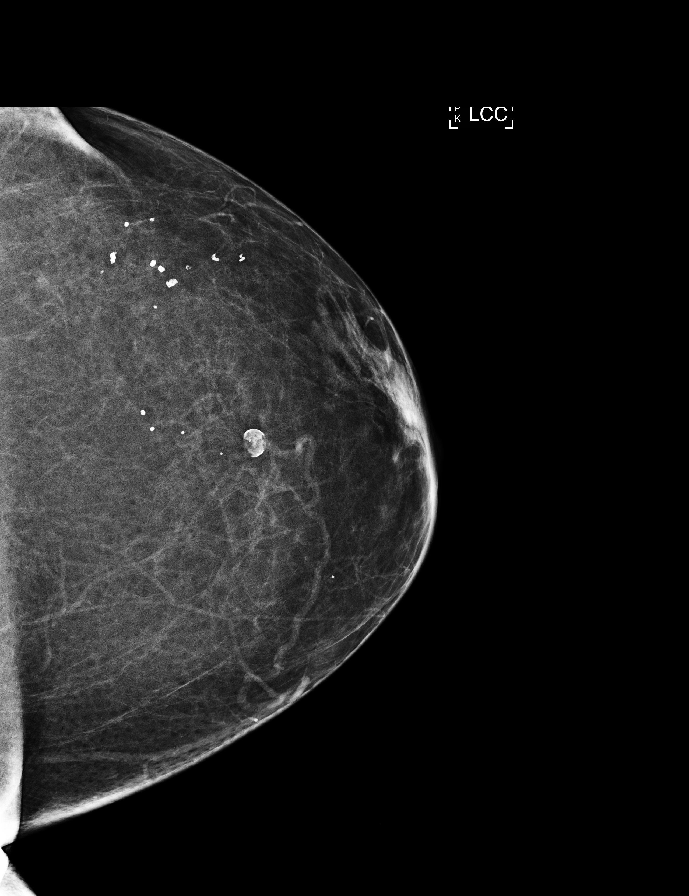

[L MLO]
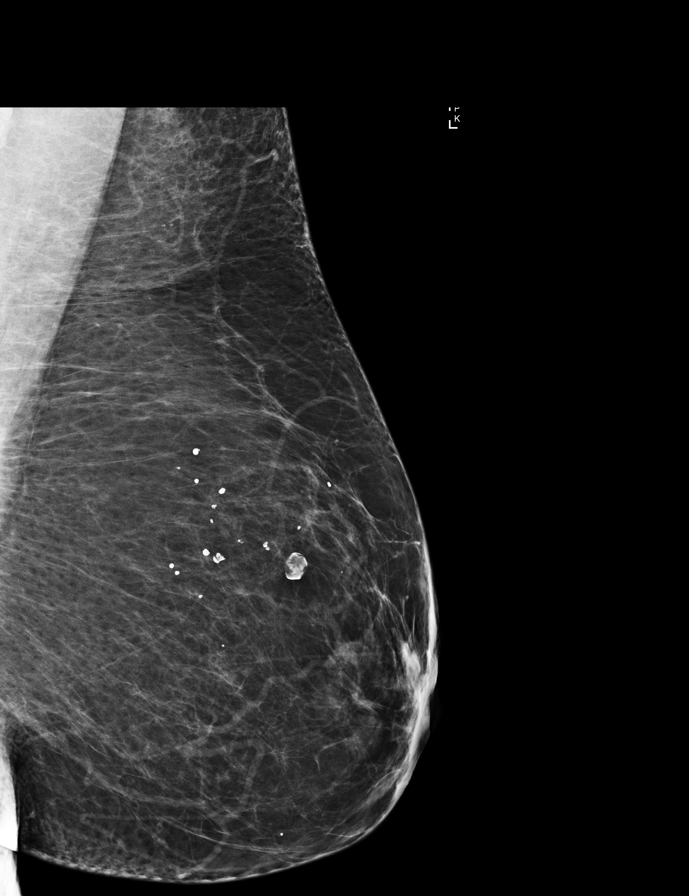

[L CC tomo · 2 of 82 frames shown]
[frame 27/82]
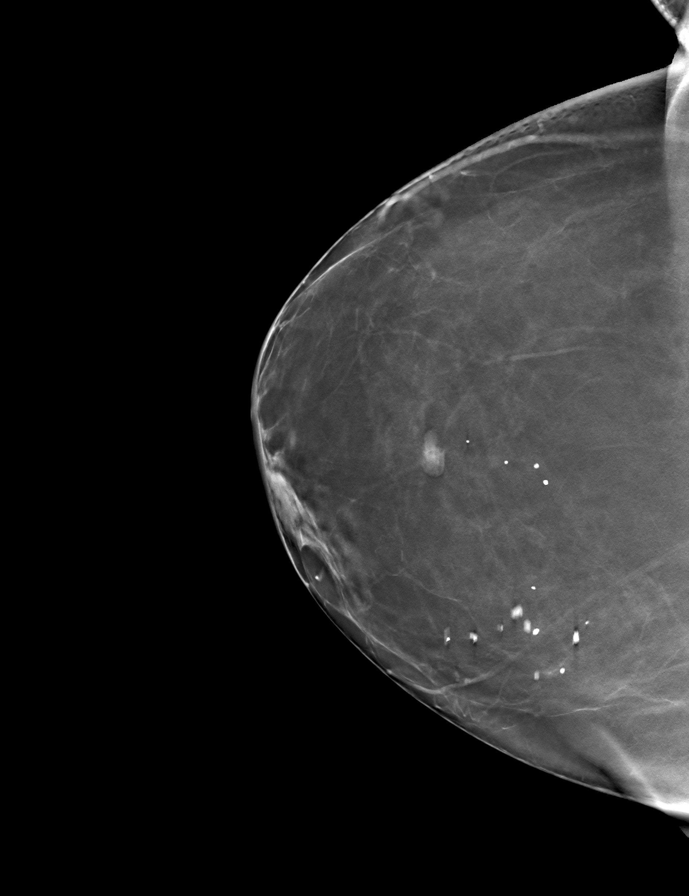
[frame 41/82]
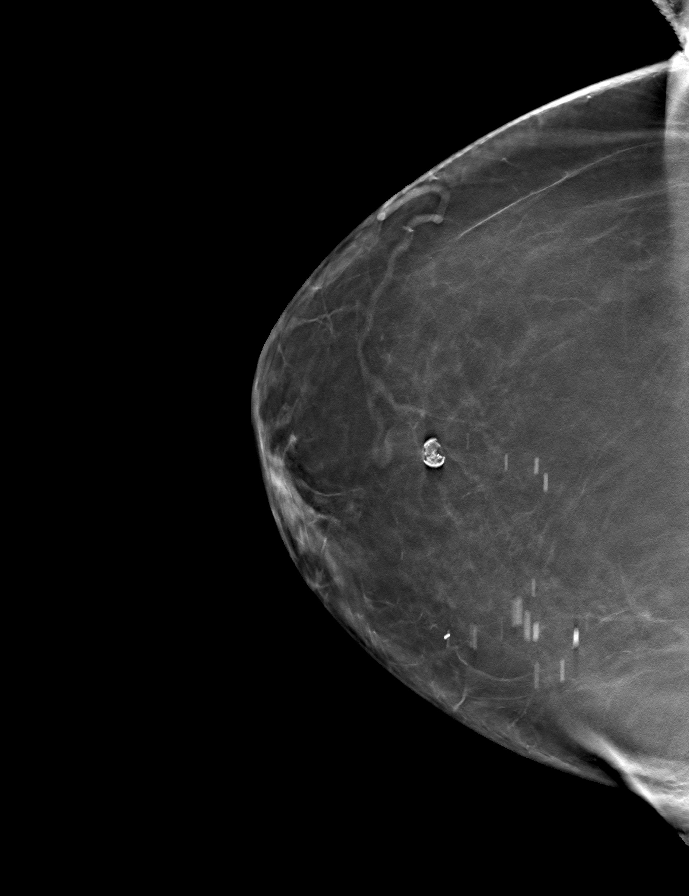

[R CC tomo · tomo slice 41/80.0]
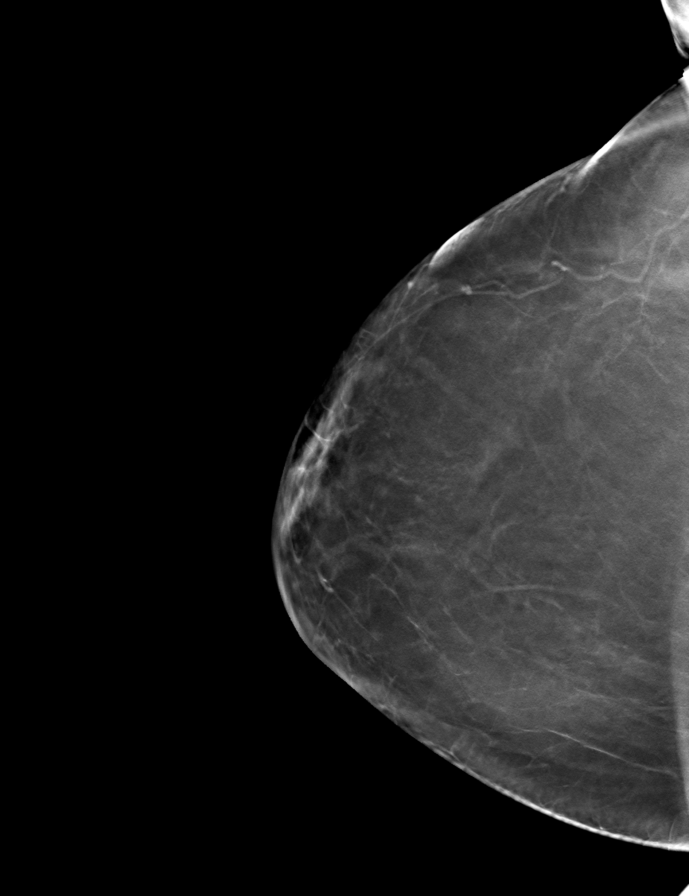

[R MLO tomo · tomo slice 45/88.0]
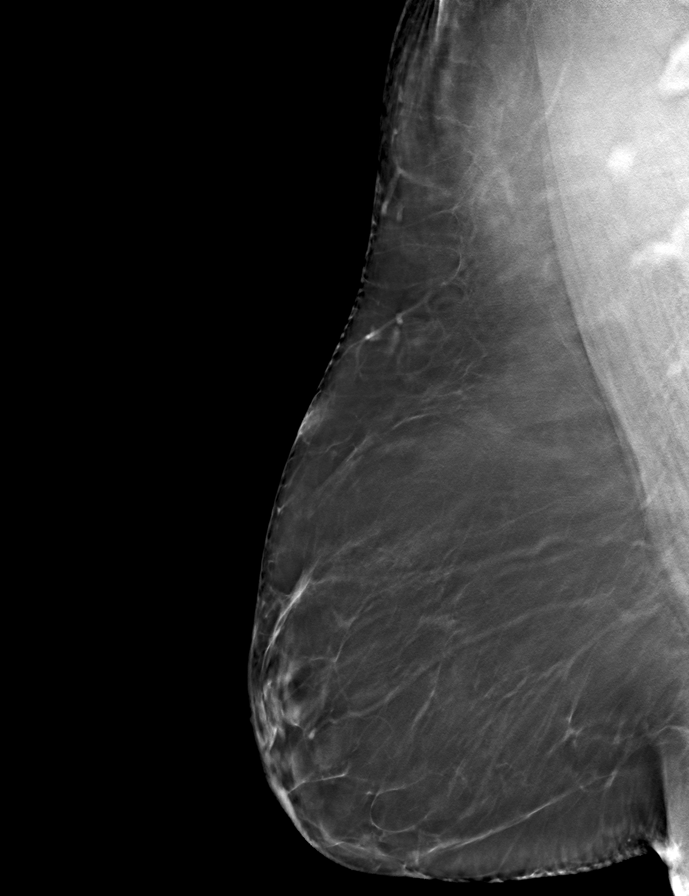

[L MLO tomo · tomo slice 44/87.0]
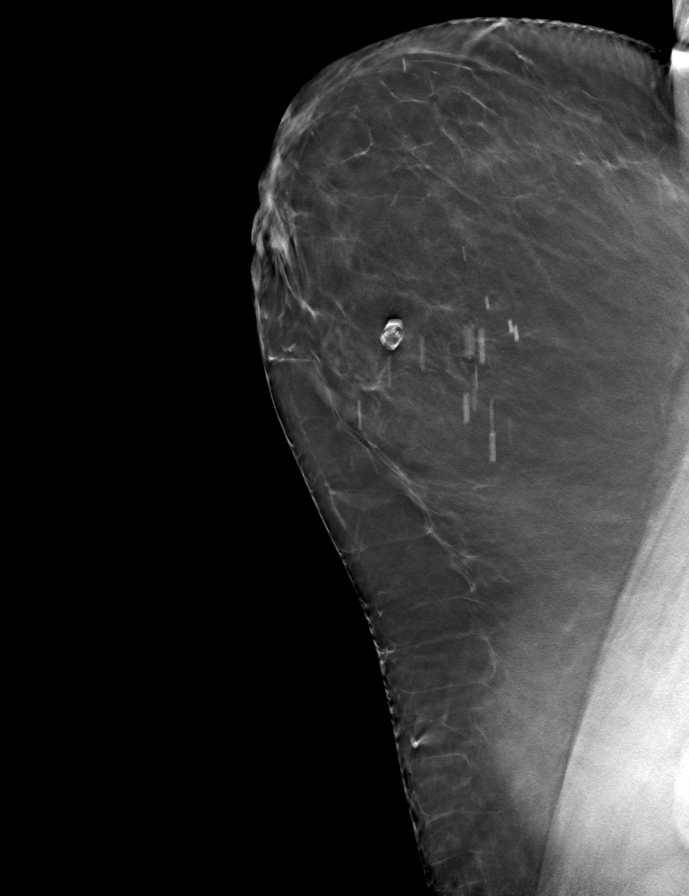

[9 of 24 positions shown; findings below may reference images not displayed]

FINDINGS: There are no findings suspicious for malignancy. Images were
processed with CAD.
IMPRESSION: No mammographic evidence of malignancy. A result letter of this
screening mammogram will be mailed directly to the patient.

RECOMMENDATION:
Screening mammogram in one year. (Code:HC-M-GZ7)

BI-RADS CATEGORY  1: Negative.

## 2016-12-05 ENCOUNTER — Other Ambulatory Visit: Payer: Self-pay | Admitting: Family Medicine

## 2016-12-05 DIAGNOSIS — Z1231 Encounter for screening mammogram for malignant neoplasm of breast: Secondary | ICD-10-CM

## 2017-01-12 ENCOUNTER — Ambulatory Visit: Payer: Medicare Other

## 2017-01-26 ENCOUNTER — Ambulatory Visit
Admission: RE | Admit: 2017-01-26 | Discharge: 2017-01-26 | Disposition: A | Discharge status: Home / Self Care | Payer: Medicare Other | Source: Ambulatory Visit | Attending: Family Medicine | Admitting: Family Medicine

## 2017-01-26 DIAGNOSIS — Z1231 Encounter for screening mammogram for malignant neoplasm of breast: Secondary | ICD-10-CM

## 2017-02-05 ENCOUNTER — Other Ambulatory Visit (INDEPENDENT_AMBULATORY_CARE_PROVIDER_SITE_OTHER): Payer: Self-pay

## 2017-02-05 DIAGNOSIS — R21 Rash and other nonspecific skin eruption: Secondary | ICD-10-CM

## 2017-02-06 MED ORDER — HYDROXYZINE HCL 25 MG PO TABS: 25.0000 mg | ORAL_TABLET | Freq: Three times a day (TID) | ORAL | 0 refills | Status: AC | PRN

## 2017-02-07 ENCOUNTER — Other Ambulatory Visit: Payer: Self-pay | Admitting: Family Medicine

## 2017-02-07 DIAGNOSIS — E785 Hyperlipidemia, unspecified: Secondary | ICD-10-CM

## 2017-06-12 ENCOUNTER — Other Ambulatory Visit: Payer: Self-pay | Admitting: Family Medicine

## 2017-06-12 DIAGNOSIS — E785 Hyperlipidemia, unspecified: Secondary | ICD-10-CM

## 2017-06-12 DIAGNOSIS — I10 Essential (primary) hypertension: Secondary | ICD-10-CM

## 2017-06-13 MED ORDER — LOSARTAN POTASSIUM-HCTZ 100-25 MG PO TABS: 1.0000 | ORAL_TABLET | Freq: Every day | ORAL | 0 refills | Status: AC

## 2017-06-13 MED ORDER — SIMVASTATIN 20 MG PO TABS: 20.0000 mg | ORAL_TABLET | Freq: Every day | ORAL | 0 refills | Status: AC

## 2017-12-20 ENCOUNTER — Other Ambulatory Visit: Payer: Self-pay | Admitting: Family Medicine

## 2017-12-20 DIAGNOSIS — Z1231 Encounter for screening mammogram for malignant neoplasm of breast: Secondary | ICD-10-CM

## 2018-01-28 ENCOUNTER — Ambulatory Visit
Admission: RE | Admit: 2018-01-28 | Discharge: 2018-01-28 | Disposition: A | Discharge status: Home / Self Care | Payer: Medicare Other | Source: Ambulatory Visit | Attending: Family Medicine | Admitting: Family Medicine

## 2018-01-28 DIAGNOSIS — Z1231 Encounter for screening mammogram for malignant neoplasm of breast: Secondary | ICD-10-CM

## 2018-02-15 ENCOUNTER — Ambulatory Visit: Payer: Self-pay | Admitting: Podiatry

## 2018-10-11 ENCOUNTER — Other Ambulatory Visit: Payer: Self-pay | Admitting: Physician Assistant

## 2018-10-11 DIAGNOSIS — E2839 Other primary ovarian failure: Secondary | ICD-10-CM

## 2018-10-11 DIAGNOSIS — Z1231 Encounter for screening mammogram for malignant neoplasm of breast: Secondary | ICD-10-CM

## 2018-12-06 ENCOUNTER — Other Ambulatory Visit: Payer: Self-pay

## 2018-12-06 ENCOUNTER — Ambulatory Visit
Admission: RE | Admit: 2018-12-06 | Discharge: 2018-12-06 | Disposition: A | Discharge status: Home / Self Care | Payer: Medicare Other | Source: Ambulatory Visit | Attending: Physician Assistant | Admitting: Physician Assistant

## 2018-12-06 DIAGNOSIS — E2839 Other primary ovarian failure: Secondary | ICD-10-CM

## 2019-01-30 ENCOUNTER — Other Ambulatory Visit: Payer: Self-pay

## 2019-01-30 ENCOUNTER — Ambulatory Visit
Admission: RE | Admit: 2019-01-30 | Discharge: 2019-01-30 | Disposition: A | Discharge status: Home / Self Care | Payer: Medicare Other | Source: Ambulatory Visit | Attending: Physician Assistant | Admitting: Physician Assistant

## 2019-01-30 DIAGNOSIS — Z1231 Encounter for screening mammogram for malignant neoplasm of breast: Secondary | ICD-10-CM

## 2019-05-29 ENCOUNTER — Ambulatory Visit: Payer: Medicare Other | Attending: Internal Medicine

## 2019-05-29 DIAGNOSIS — Z23 Encounter for immunization: Secondary | ICD-10-CM | POA: Insufficient documentation

## 2019-05-29 NOTE — Progress Notes (Signed)
   Covid-19 Vaccination Clinic  Name:  Johan Antonacci    MRN: [id number] DOB: [birth year]  05/29/2019  Ms. Unangst was observed post Covid-19 immunization for 30 minutes based on pre-vaccination screening without incidence. She was provided with Vaccine Information Sheet and instruction to access the V-Safe system.   Ms. Shinn was instructed to call 911 with any severe reactions post vaccine: Marland Kitchen Difficulty breathing  . Swelling of your face and throat  . A fast heartbeat  . A bad rash all over your body  . Dizziness and weakness    Immunizations Administered    Name Date Dose VIS Date Route   Pfizer COVID-19 Vaccine 05/29/2019  2:34 PM 0.3 mL 03/14/2019 Intramuscular   Manufacturer: [employer], [employer]   Lot: [id number]   NDC: [address]-1

## 2019-06-24 ENCOUNTER — Ambulatory Visit: Payer: Medicare Other | Attending: Internal Medicine

## 2019-06-24 DIAGNOSIS — Z23 Encounter for immunization: Secondary | ICD-10-CM

## 2019-06-24 NOTE — Progress Notes (Signed)
   Covid-19 Vaccination Clinic  Name:  [name]    MRN: [id number] DOB: 11/08/1945  06/24/2019  [name] was observed post Covid-19 immunization for 15 minutes without incident. She was provided with Vaccine Information Sheet and instruction to access the V-Safe system.   [name] was instructed to call 911 with any severe reactions post vaccine: Marland Kitchen Difficulty breathing  . Swelling of face and throat  . A fast heartbeat  . A bad rash all over body  . Dizziness and weakness   Immunizations Administered    Name Date Dose VIS Date Route   Pfizer COVID-19 Vaccine 06/24/2019  8:51 AM 0.3 mL 03/14/2019 Intramuscular   Manufacturer: [employer], [employer]   Lot: [id number]   NDC: [address]-8

## 2020-01-06 ENCOUNTER — Other Ambulatory Visit: Payer: Self-pay | Admitting: Family Medicine

## 2020-01-06 DIAGNOSIS — Z1231 Encounter for screening mammogram for malignant neoplasm of breast: Secondary | ICD-10-CM

## 2020-02-02 ENCOUNTER — Ambulatory Visit: Payer: Medicare Other

## 2020-03-05 ENCOUNTER — Ambulatory Visit: Payer: Medicare Other

## 2020-04-14 ENCOUNTER — Ambulatory Visit: Payer: Medicare Other

## 2020-06-17 ENCOUNTER — Inpatient Hospital Stay: Admission: RE | Admit: 2020-06-17 | Payer: Medicare Other | Source: Ambulatory Visit

## 2020-08-09 ENCOUNTER — Ambulatory Visit
Admission: RE | Admit: 2020-08-09 | Discharge: 2020-08-09 | Disposition: A | Discharge status: Home / Self Care | Payer: Medicare Other | Source: Ambulatory Visit | Attending: Family Medicine | Admitting: Family Medicine

## 2020-08-09 ENCOUNTER — Other Ambulatory Visit: Payer: Self-pay

## 2020-08-09 DIAGNOSIS — Z1231 Encounter for screening mammogram for malignant neoplasm of breast: Secondary | ICD-10-CM

## 2021-01-23 LAB — COLOGUARD: COLOGUARD: NEGATIVE

## 2021-01-23 LAB — EXTERNAL GENERIC LAB PROCEDURE: COLOGUARD: NEGATIVE

## 2021-07-14 ENCOUNTER — Other Ambulatory Visit: Payer: Self-pay | Admitting: Family Medicine

## 2021-07-14 DIAGNOSIS — Z1231 Encounter for screening mammogram for malignant neoplasm of breast: Secondary | ICD-10-CM

## 2021-08-10 ENCOUNTER — Ambulatory Visit: Payer: Medicare Other

## 2021-09-15 ENCOUNTER — Ambulatory Visit
Admission: RE | Admit: 2021-09-15 | Discharge: 2021-09-15 | Disposition: A | Discharge status: Home / Self Care | Payer: Medicare Other | Source: Ambulatory Visit | Attending: Family Medicine | Admitting: Family Medicine

## 2021-09-15 DIAGNOSIS — Z1231 Encounter for screening mammogram for malignant neoplasm of breast: Secondary | ICD-10-CM
